# Patient Record
Sex: Female | Born: 1975 | Race: White | Hispanic: No | Marital: Married | Smoking: Never smoker
Health system: Southern US, Community
[De-identification: ages and names within clinical notes are randomized; demographics above are authoritative.]

## PROBLEM LIST (undated history)

## (undated) DIAGNOSIS — E119 Type 2 diabetes mellitus without complications: Secondary | ICD-10-CM

## (undated) DIAGNOSIS — E785 Hyperlipidemia, unspecified: Secondary | ICD-10-CM

## (undated) DIAGNOSIS — G4733 Obstructive sleep apnea (adult) (pediatric): Secondary | ICD-10-CM

## (undated) HISTORY — DX: Type 2 diabetes mellitus without complications: E11.9

## (undated) HISTORY — DX: Obstructive sleep apnea (adult) (pediatric): G47.33

## (undated) HISTORY — DX: Hyperlipidemia, unspecified: E78.5

## (undated) HISTORY — PX: BILATERAL SALPINGECTOMY: SHX5743

## (undated) HISTORY — PX: TOTAL VAGINAL HYSTERECTOMY: SHX2548

---

## 2016-12-03 ENCOUNTER — Emergency Department
Admission: EM | Admit: 2016-12-03 | Discharge: 2016-12-03 | Disposition: A | Discharge status: Home / Self Care | Payer: Self-pay | Attending: Emergency Medicine | Admitting: Emergency Medicine

## 2016-12-03 DIAGNOSIS — H1033 Unspecified acute conjunctivitis, bilateral: Secondary | ICD-10-CM

## 2016-12-03 DIAGNOSIS — J069 Acute upper respiratory infection, unspecified: Secondary | ICD-10-CM | POA: Insufficient documentation

## 2016-12-03 DIAGNOSIS — H1089 Other conjunctivitis: Secondary | ICD-10-CM | POA: Insufficient documentation

## 2016-12-03 MED ORDER — PSEUDOEPH-BROMPHEN-DM 30-2-10 MG/5ML PO SYRP: 5.0000 mL | ORAL_SOLUTION | Freq: Four times a day (QID) | ORAL | 0 refills | Status: DC | PRN

## 2016-12-03 MED ORDER — DEXAMETHASONE SODIUM PHOSPHATE 10 MG/ML IJ SOLN
10.0000 mg | Freq: Once | INTRAMUSCULAR | Status: AC
  Administered 2016-12-03: 10 mg via INTRAMUSCULAR
  Filled 2016-12-03: qty 1

## 2016-12-03 MED ORDER — GENTAMICIN SULFATE 0.3 % OP SOLN: 2.0000 [drp] | OPHTHALMIC | 0 refills | Status: DC

## 2016-12-03 MED ORDER — FLUTICASONE PROPIONATE 50 MCG/ACT NA SUSP: 2.0000 | Freq: Every day | NASAL | 0 refills | Status: DC

## 2016-12-03 MED ORDER — BENZONATATE 100 MG PO CAPS: 100.0000 mg | ORAL_CAPSULE | Freq: Three times a day (TID) | ORAL | 0 refills | Status: DC | PRN

## 2016-12-03 NOTE — Discharge Instructions (Signed)
Take the prescription meds as directed. Continue to rest and treat fevers as needed. Follow-up with your provider or [facility] for continued symptoms. Rest and hydrate.

## 2016-12-03 NOTE — ED Triage Notes (Signed)
Sob when coughing, yellow productive sputum in small amounts per patient. Sore throat. Sx X 2 days. Eyes reddened. Pt alert and oriented X4, active, cooperative, pt in NAD. RR even and unlabored, color WNL.

## 2016-12-03 NOTE — ED Provider Notes (Signed)
[facility] Emergency Department Provider Note ____________________________________________  Time seen: 1608  I have reviewed the triage vital signs and the nursing notes.  HISTORY  Chief Complaint  Cough; Shortness of Breath; and Sore Throat  HPI Gloria Wade is a 41 y.o. female presents to the ED with complaints of 2 days of body aches, sudden cough, with intermittent productive sputum, as well as sore throat. Patient is also noting I irritation bilaterally with matting and crusting. She reports only subjective fevers over the last few days but has been taking over-the-counter cough and cold medication. She's also had intermittent episodes of nausea, vomiting, and diarrhea.  History reviewed. No pertinent past medical history.  There are no active problems to display for this patient.  History reviewed. No pertinent surgical history.  Prior to Admission medications   Medication Sig Start Date End Date Taking? Authorizing Provider  benzonatate (TESSALON PERLES) 100 MG capsule Take 1 capsule (100 mg total) by mouth 3 (three) times daily as needed for cough (Take 1-2 per dose). 12/03/16   Deborrah Mabin V Bacon Pamella Samons, PA-C  brompheniramine-pseudoephedrine-DM 30-2-10 MG/5ML syrup Take 5 mLs by mouth 4 (four) times daily as needed. 12/03/16   Nayquan Evinger V Bacon Seneca Gadbois, PA-C  fluticasone (FLONASE) 50 MCG/ACT nasal spray Place 2 sprays into both nostrils daily. 12/03/16   Margreat Widener V Bacon Carylon Tamburro, PA-C  gentamicin (GARAMYCIN) 0.3 % ophthalmic solution Place 2 drops into both eyes every 4 (four) hours. 12/03/16   Charlesetta IvoryJenise V Bacon Keena Heesch, PA-C    Allergies Patient has no known allergies.  No family history on file.  Social History Social History  Substance Use Topics  . Smoking status: Never Smoker  . Smokeless tobacco: Never Used  . Alcohol use No    Review of Systems  Constitutional: Negative for fever. Eyes: Negative for visual changes. ENT: Positive for sore  throat. Cardiovascular: Negative for chest pain. Respiratory: Negative for shortness of breath. Gastrointestinal: Negative for abdominal pain. Reports vomiting and diarrhea. Genitourinary: Negative for dysuria. Musculoskeletal: Negative for back pain. Reports generalized bodyaches. Skin: Negative for rash. Neurological: Negative for headaches, focal weakness or numbness. ____________________________________________  PHYSICAL EXAM:  VITAL SIGNS: ED Triage Vitals [12/03/16 1441]  Enc Vitals Group     BP (!) 152/84     Pulse Rate 97     Resp 16     Temp 98.7 F (37.1 C)     Temp Source Oral     SpO2 98 %     Weight 178 lb (80.7 kg)     Height 5' (1.524 m)     Head Circumference      Peak Flow      Pain Score 7     Pain Loc      Pain Edu?      Excl. in GC?     Constitutional: Alert and oriented. Well appearing and in no distress. Head: Normocephalic and atraumatic. Eyes: Conjunctivae are normal. PERRL. Normal extraocular movements Ears: Canals clear. TMs intact bilaterally. Nose: No congestion/rhinorrhea/epistaxis. Nasal turbinate erythema and dryness and dried purulent material.  Mouth/Throat: Mucous membranes are moist. Uvula is midline and tonsils are flat. Mild oropharyngeal erythema is appreciated without exudate. Neck: Supple. No thyromegaly. Hematological/Lymphatic/Immunological: No cervical lymphadenopathy. Cardiovascular: Normal rate, regular rhythm. Normal distal pulses. Respiratory: Normal respiratory effort. No wheezes/rales/rhonchi. Skin:  Skin is warm, dry and intact. No rash noted. ____________________________________________   LABS (pertinent positives/negatives) Labs Reviewed  CULTURE, GROUP A STREP [facility]   Rapid Strep - Negative  ____________________________________________  PROCEDURES  Decadron 10 mg PO ____________________________________________  INITIAL IMPRESSION / ASSESSMENT AND PLAN / ED COURSE  Patient with a presentation likely  representing a bowel upper respiratory infection including influenza. Patient is discharged at this time with prescriptions for Tessalon Perles, Bromfed-DM syrup, and Flonase. She is also discharged with a prescription for Garamycin eyedrops for her bilateral bacterial conjunctivitis. She is advised to follow with her primary care provider for ongoing symptom management. She should return as necessary for acutely worsening symptoms. ____________________________________________  FINAL CLINICAL IMPRESSION(S) / ED DIAGNOSES  Final diagnoses:  Viral upper respiratory tract infection  Acute bacterial conjunctivitis of both eyes      Lissa Hoard, PA-C 12/03/16 1749    Myrna Blazer, MD 12/03/16 2351

## 2018-09-07 ENCOUNTER — Emergency Department (HOSPITAL_COMMUNITY)
Admission: EM | Admit: 2018-09-07 | Discharge: 2018-09-07 | Disposition: A | Discharge status: Home / Self Care | Payer: Self-pay | Attending: Emergency Medicine | Admitting: Emergency Medicine

## 2018-09-07 ENCOUNTER — Emergency Department (HOSPITAL_BASED_OUTPATIENT_CLINIC_OR_DEPARTMENT_OTHER): Payer: Self-pay

## 2018-09-07 DIAGNOSIS — Z79899 Other long term (current) drug therapy: Secondary | ICD-10-CM | POA: Insufficient documentation

## 2018-09-07 DIAGNOSIS — M79605 Pain in left leg: Secondary | ICD-10-CM | POA: Insufficient documentation

## 2018-09-07 DIAGNOSIS — R52 Pain, unspecified: Secondary | ICD-10-CM

## 2018-09-07 MED ORDER — MELOXICAM 7.5 MG PO TABS: 7.5000 mg | ORAL_TABLET | Freq: Every day | ORAL | 0 refills | Status: AC

## 2018-09-07 NOTE — Progress Notes (Signed)
Preliminary notes--Left lower extremity venous duplex exam completed. Negative for DVT.  Nimrit Kehres H Emmarose Klinke(RDMS RVT) 09/07/18 4:47 PM

## 2018-09-07 NOTE — ED Triage Notes (Signed)
Pt was sent here from UC to rule out blood clot after having left calf pain into left thigh.

## 2018-09-07 NOTE — ED Provider Notes (Signed)
[facility] EMERGENCY DEPARTMENT Provider Note   CSN: [id number] Arrival date & time: 09/07/18  1313     History   Chief Complaint Chief Complaint  Patient presents with  . Leg Pain    HPI Gloria Wade is a 42 y.o. female.  42 year old female with complaint of leg pain.  Patient states that she had intermittent posterior left knee pain for the past month, Pain Now Radiates down Her Left and up her left groin areas.  Patient notes swelling of the left leg yesterday, she applied topical pain relieving medication to her by which she thinks helped with the swelling.  Denies history of injury to the extremity, recent extended travel, history of cancer, oral hormone replacement therapy use.  No personal or family history of PE or DVT.     No past medical history on file.  There are no active problems to display for this patient.   No past surgical history on file.   OB History   None      Home Medications    Prior to Admission medications   Medication Sig Start Date End Date Taking? Authorizing Provider  benzonatate (TESSALON PERLES) 100 MG capsule Take 1 capsule (100 mg total) by mouth 3 (three) times daily as needed for cough (Take 1-2 per dose). 12/03/16   Menshew, Charlesetta Ivory, PA-C  brompheniramine-pseudoephedrine-DM 30-2-10 MG/5ML syrup Take 5 mLs by mouth 4 (four) times daily as needed. 12/03/16   Menshew, Charlesetta Ivory, PA-C  fluticasone (FLONASE) 50 MCG/ACT nasal spray Place 2 sprays into both nostrils daily. 12/03/16   Menshew, Charlesetta Ivory, PA-C  gentamicin (GARAMYCIN) 0.3 % ophthalmic solution Place 2 drops into both eyes every 4 (four) hours. 12/03/16   Menshew, Charlesetta Ivory, PA-C  naproxen sodium (ALEVE) 220 MG tablet Take 220 mg by mouth every 12 (twelve) hours as needed for pain.    [provider]    Family History No family history on file.  Social History Social History   Tobacco Use  . Smoking status: Never Smoker  .  Smokeless tobacco: Never Used  Substance Use Topics  . Alcohol use: No  . Drug use: Not on file     Allergies   Patient has no known allergies.   Review of Systems Review of Systems  Constitutional: Negative for fever.  Respiratory: Negative for shortness of breath.   Cardiovascular: Negative for chest pain.  Musculoskeletal: Positive for arthralgias and myalgias. Negative for gait problem and joint swelling.  Skin: Negative for color change, rash and wound.  Allergic/Immunologic: Negative for immunocompromised state.  Hematological: Negative for adenopathy. Does not bruise/bleed easily.  Psychiatric/Behavioral: Negative for confusion.  All other systems reviewed and are negative.    Physical Exam Updated Vital Signs BP 134/71 (BP Location: Right Arm)   Pulse 89   Temp 98.1 F (36.7 C) (Oral)   Resp 16   Ht 5\' 1"  (1.549 m)   Wt 76.7 kg   SpO2 100%   BMI 31.93 kg/m   Physical Exam  Constitutional: She is oriented to person, place, and time. She appears well-developed and well-nourished. No distress.  HENT:  Head: Normocephalic and atraumatic.  Pulmonary/Chest: Effort normal.  Musculoskeletal: She exhibits tenderness. She exhibits no deformity.       Left knee: She exhibits normal range of motion, no swelling, no effusion, no ecchymosis and no deformity. Tenderness found.       Legs: Neurological: She is alert and oriented to  person, place, and time. No sensory deficit.  Skin: Skin is warm and dry. No rash noted. She is not diaphoretic. No erythema.  Psychiatric: She has a normal mood and affect. Her behavior is normal.  Nursing note and vitals reviewed.    ED Treatments / Results  Labs (all labs ordered are listed, but only abnormal results are displayed) Labs Reviewed - No data to display  EKG None  Radiology No results found.  Procedures Procedures (including critical care time)  Medications Ordered in ED Medications - No data to  display   Initial Impression / Assessment and Plan / ED Course  I have reviewed the triage vital signs and the nursing notes.  Pertinent labs & imaging results that were available during my care of the patient were reviewed by me and considered in my medical decision making (see chart for details).  Clinical Course as of Sep 08 1727  Tue Sep 07, 2018  [id number] 49-year-old female with left leg pain.  Venous Doppler is negative for DVT.  Advised patient to continue with Motrin and Tylenol as needed as directed for pain, also recommend compression hose.  Patient has a PCP.  Advised patient to call her PCP in the morning to arrange follow-up care, return to ER for worsening or concerning symptoms.   [LM]    Clinical Course User Index [LM] Jeannie Fend, PA-C   Final Clinical Impressions(s) / ED Diagnoses   Final diagnoses:  Left leg pain    ED Discharge Orders    None       Jeannie Fend, PA-C 09/07/18 1728    Derwood Kaplan, MD 09/08/18 1310

## 2018-09-07 NOTE — ED Provider Notes (Signed)
Patient placed in Quick Look pathway, seen and evaluated   Chief Complaint:left lower extremity pain  HPI:  Gloria Wade is a 42 y.o. female who presents to the ED from Urgent Care for evaluation of left lower extremity pain. Patient reports that the pain goes from her calf to her posterior thigh. Patient denies any recent travel.    ROS: M/S: left leg pain  Physical Exam:  BP 134/71 (BP Location: Right Arm)   Pulse 89   Temp 98.1 F (36.7 C) (Oral)   Resp 16   Ht 5\' 1"  (1.549 m)   Wt 76.7 kg   SpO2 100%   BMI 31.93 kg/m    Gen: No distress  Neuro: Awake and Alert  Skin: Warm and dry  M/S: tender left calf on exam    Initiation of care has begun. The patient has been counseled on the process, plan, and necessity for staying for the completion/evaluation, and the remainder of the medical screening examination    Janne Napoleon, NP 09/07/18 1330    Linwood Dibbles, MD 09/07/18 [phone]

## 2018-09-07 NOTE — ED Notes (Signed)
Patient transported to vascular. 

## 2018-09-07 NOTE — Discharge Instructions (Addendum)
Your ultrasound today does not show a blood clot in your leg. You should follow up with a primary care provider, a referral was given if needed. Return to the ER for worsening or concerning symptoms.  For your pain try wearing compression socks- apply first thing in the morning before getting out of bed.

## 2020-01-10 ENCOUNTER — Ambulatory Visit: Payer: Self-pay | Admitting: Primary Care

## 2020-01-31 ENCOUNTER — Encounter: Payer: Self-pay | Admitting: Primary Care

## 2020-01-31 ENCOUNTER — Ambulatory Visit (INDEPENDENT_AMBULATORY_CARE_PROVIDER_SITE_OTHER)
Admission: RE | Admit: 2020-01-31 | Discharge: 2020-01-31 | Disposition: A | Discharge status: Home / Self Care | Payer: 59 | Source: Ambulatory Visit | Attending: Primary Care | Admitting: Primary Care

## 2020-01-31 ENCOUNTER — Ambulatory Visit (INDEPENDENT_AMBULATORY_CARE_PROVIDER_SITE_OTHER): Payer: 59 | Admitting: Primary Care

## 2020-01-31 ENCOUNTER — Other Ambulatory Visit: Payer: Self-pay

## 2020-01-31 VITALS — BP 120/82 | HR 82 | Temp 96.4°F | Ht 59.0 in | Wt 174.2 lb

## 2020-01-31 DIAGNOSIS — R002 Palpitations: Secondary | ICD-10-CM

## 2020-01-31 DIAGNOSIS — M25562 Pain in left knee: Secondary | ICD-10-CM

## 2020-01-31 DIAGNOSIS — R0683 Snoring: Secondary | ICD-10-CM | POA: Diagnosis not present

## 2020-01-31 DIAGNOSIS — G8929 Other chronic pain: Secondary | ICD-10-CM | POA: Diagnosis not present

## 2020-01-31 DIAGNOSIS — M25561 Pain in right knee: Secondary | ICD-10-CM | POA: Diagnosis not present

## 2020-01-31 DIAGNOSIS — M2550 Pain in unspecified joint: Secondary | ICD-10-CM | POA: Diagnosis not present

## 2020-01-31 DIAGNOSIS — G4733 Obstructive sleep apnea (adult) (pediatric): Secondary | ICD-10-CM | POA: Insufficient documentation

## 2020-01-31 LAB — BASIC METABOLIC PANEL
BUN: 15 mg/dL (ref 6–23)
CO2: 30 mEq/L (ref 19–32)
Calcium: 9.3 mg/dL (ref 8.4–10.5)
Chloride: 101 mEq/L (ref 96–112)
Creatinine, Ser: 0.67 mg/dL (ref 0.40–1.20)
GFR: 94.87 mL/min (ref >60.00)
Glucose, Bld: 145 mg/dL — ABNORMAL HIGH (ref 70–99)
Potassium: 3.6 mEq/L (ref 3.5–5.1)
Sodium: 137 mEq/L (ref 135–145)

## 2020-01-31 LAB — CBC
HCT: 36.9 % (ref 36.0–46.0)
Hemoglobin: 12 g/dL (ref 12.0–15.0)
MCHC: 32.6 g/dL (ref 30.0–36.0)
MCV: 75 fl — ABNORMAL LOW (ref 78.0–100.0)
Platelets: 321 10*3/uL (ref 150.0–400.0)
RBC: 4.92 Mil/uL (ref 3.87–5.11)
RDW: 17.6 % — ABNORMAL HIGH (ref 11.5–15.5)
WBC: 8.2 10*3/uL (ref 4.0–10.5)

## 2020-01-31 LAB — SEDIMENTATION RATE: Sed Rate: 53 mm/hr — ABNORMAL HIGH (ref 0–20)

## 2020-01-31 LAB — TSH: TSH: 3.54 u[IU]/mL (ref 0.35–4.50)

## 2020-01-31 LAB — C-REACTIVE PROTEIN: CRP: <1 mg/dL (ref 0.5–20.0)

## 2020-01-31 NOTE — Assessment & Plan Note (Signed)
Chronic, also with periods of apnea and daytime tiredness.  Referral placed to pulmonology for evaluation and sleep study.

## 2020-01-31 NOTE — Assessment & Plan Note (Signed)
Chronic and to numerous areas of her body. No swelling on exam.  Check CCP, RF, Sed rate, CRP.

## 2020-01-31 NOTE — Progress Notes (Signed)
Subjective:    Patient ID: Gloria Wade, female    DOB: Jul 10, 1976, 44 y.o.   MRN: [id number]  HPI  This visit occurred during the SARS-CoV-2 public health emergency.  Safety protocols were in place, including screening questions prior to the visit, additional usage of staff PPE, and extensive cleaning of exam room while observing appropriate contact time as indicated for disinfecting solutions.   Gloria Wade is a 44 year old female who presents today to establish care and discuss the problems mentioned below. Will obtain/review records.  1) Snoring: Chronic for years. Her husband has witnessed periods of apnea and choking while she sleeps during the night. She's actually woken from sleep during the night, gasping for air and choking. Thinks she may have sleep apnea, but she's never had a sleep study.  2) Chronic Knee Pain/Joint Pain: Located to bilateral knees for the last 5-6 years, worse to left lateral anterior knee. Also with tingling to left lower extremity that occurs intermittently with walking, this began about two years ago. She does have right lower back and hip pain, improved with chiropractic treatments thus far, she has 6 treatments left. She underwent xray several weeks ago of the right hip, unsure of the results. Also with bilateral elbow pain.  She works on her feet and stands for 12 hours at a time, most everyday of the week. She does wear comfortable shoes with extra insoles. She denies weakness.   3) Palpitations: Intermittent for the last two years, occurring most everyday, lasting a few seconds at a time. She denies chest pain, anxiety, shortness of breath, dizziness, family history of heart disease.   BP Readings from Last 3 Encounters:  01/31/20 120/82  09/07/18 139/81  12/03/16 (!) 152/84     Review of Systems  Eyes: Negative for visual disturbance.  Respiratory: Negative for shortness of breath.        Snoring with periods of apnea during sleep.  Cardiovascular:  Positive for palpitations. Negative for chest pain.  Musculoskeletal: Positive for arthralgias. Negative for joint swelling.       Elbows, right hip, right lower back, bilateral knees  Neurological: Negative for dizziness and headaches.  Psychiatric/Behavioral: The patient is not nervous/anxious.        No past medical history on file.   Social History   Socioeconomic History  . Marital status: Married    Spouse name: Not on file  . Number of children: Not on file  . Years of education: Not on file  . Highest education level: Not on file  Occupational History  . Not on file  Tobacco Use  . Smoking status: Never Smoker  . Smokeless tobacco: Never Used  Substance and Sexual Activity  . Alcohol use: No  . Drug use: Not on file  . Sexual activity: Not on file  Other Topics Concern  . Not on file  Social History Narrative  . Not on file   Social Determinants of Health   Financial Resource Strain:   . Difficulty of Paying Living Expenses:   Food Insecurity:   . Worried About Programme researcher, broadcasting/film/video in the Last Year:   . Barista in the Last Year:   Transportation Needs:   . Freight forwarder (Medical):   Marland Kitchen Lack of Transportation (Non-Medical):   Physical Activity:   . Days of Exercise per Week:   . Minutes of Exercise per Session:   Stress:   . Feeling of Stress :  Social Connections:   . Frequency of Communication with Friends and Family:   . Frequency of Social Gatherings with Friends and Family:   . Attends Religious Services:   . Active Member of Clubs or Organizations:   . Attends Archivist Meetings:   Marland Kitchen Marital Status:   Intimate Partner Violence:   . Fear of Current or Ex-Partner:   . Emotionally Abused:   Marland Kitchen Physically Abused:   . Sexually Abused:     No past surgical history on file.  No family history on file.  No Known Allergies  No current outpatient medications on file prior to visit.   No current facility-administered  medications on file prior to visit.    BP 120/82   Pulse 82   Temp (!) 96.4 F (35.8 C) (Temporal)   Ht 4\' 11"  (1.499 m)   Wt 174 lb 4 oz (79 kg)   LMP  (Exact Date)   SpO2 98%   BMI 35.19 kg/m    Objective:   Physical Exam  Constitutional: She appears well-nourished.  Cardiovascular: Normal rate and regular rhythm.  Respiratory: Effort normal and breath sounds normal.  Musculoskeletal:     Cervical back: Neck supple.     Left knee: No swelling, deformity, effusion or erythema. Normal range of motion. Tenderness present over the lateral joint line.       Legs:     Comments: 5/5 strength with knee extension bilaterally   Skin: Skin is warm and dry.  Psychiatric: She has a normal mood and affect.           Assessment & Plan:

## 2020-01-31 NOTE — Patient Instructions (Signed)
?????????????????????????   xray ?????????????????????? ???????????????????????????????????????????????????  ??????????????????????????????????????????????????????????????????????????????????????????? ???????????????????????????????????????????????????????????????  ???????????????????????????????????????????????????????????????????????  ?????????????????????????????????????????! ?????????????????????????????????????????????????????? ?????????????????????????! banhchhob daoy montirpisaoth ning xray  moun pel chakchenh now thngainih .  khnhom nung choundamnoeng anak ampi lotthophl robsa anak nowpel ttuol ban .   anak nung trauv ban teaktng teaktng nung kar banhchoun anak tow vechchobandet suot daembi veayotamlei kar keng min dakadangheum .  saum brab yeung brasenbae anak min trauv ban teaktng knongorypel pir sa bta  .   kamntpelvelea chuob cheamuoy vechchobandet kau l len teaktng nung chongkong ning sanleak robsa anak .   khnhom mean sechaktei rikreay del ban chuob anak thngainih!  saumkom stakstaer knong kar haw ryy phnhae sar mk khnhom daoy mean saamnuor .  saumosvakom mokkan li bu vu r!   Stop by the lab and xray prior to leaving today. I will notify you of your results once received.   You will be contacted regarding your referral to the lung doctor for evaluation of sleep apnea.  Please let us know if you have not been contacted within two weeks.   Schedule a visit with Dr. Patsy Lager regarding your knees and joints.  It was a pleasure to meet you today! Please don't hesitate to call or message me with any questions. Welcome to [facility]!

## 2020-01-31 NOTE — Assessment & Plan Note (Signed)
Intermittent for 2 years, occurring daily, lasting seconds. No other accompanying symptoms. Rate and rhythm regular today.   Check labs including TSH, CBC, BMP. Consider ECG.  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 No family history of thyroid disorder or heart disease. Await labs. Consider Holter Monitor evaluation.

## 2020-01-31 NOTE — Assessment & Plan Note (Signed)
Chronic bilaterally, worse to left.   Suspect pain to be caused by multiple factors including obesity, standing on concrete. She does seem to have numerous other joint aches.  Check plain films today. Also check CCP, RF, Sed rate.  She will schedule a visit with sports medicine.

## 2020-02-01 ENCOUNTER — Other Ambulatory Visit: Payer: 59

## 2020-02-01 DIAGNOSIS — R739 Hyperglycemia, unspecified: Secondary | ICD-10-CM

## 2020-02-01 LAB — RHEUMATOID FACTOR: Rhuematoid fact SerPl-aCnc: <14 IU/mL (ref <14)

## 2020-02-01 LAB — CYCLIC CITRUL PEPTIDE ANTIBODY, IGG: Cyclic Citrullin Peptide Ab: <16 UNITS

## 2020-02-02 LAB — HEMOGLOBIN A1C
Hgb A1c MFr Bld: 5.5 % of total Hgb (ref <5.7)
Mean Plasma Glucose: 111 (calc)
eAG (mmol/L): 6.2 (calc)

## 2020-02-07 NOTE — Progress Notes (Signed)
Carlyann Placide T. Jayvion Stefanski, MD, [place]  [facility] and [place] at [facility] [place], [address]  Phone: [phone]  FAX: [phone]  Gloria Wade - 44 y.o. female  MRN [id number]  Date of Birth: [birth year]  Date: 02/08/2020  PCP: Pleas Koch, NP  Referral: Pleas Koch, NP  Chief Complaint  Patient presents with  . Knee Pain    Bilateral  . Pain in Joints  . Back Pain    Have been seeing a chiropractor    This visit occurred during the SARS-CoV-2 public health emergency.  Safety protocols were in place, including screening questions prior to the visit, additional usage of staff PPE, and extensive cleaning of exam room while observing appropriate contact time as indicated for disinfecting solutions.   Subjective:   Gloria Wade is a 44 y.o. very pleasant female patient with Body mass index is 35.24 kg/m. who presents with the following:  She is a pleasant young lady who presents with some bilateral knee pain.  I independently reviewed her plain films myself and there is some mild to moderate degenerative changes.  This is most obvious in the medial compartment.  As I talked to the patient however, she relates that she is having pain throughout much of her body.  She has hand pain, finger pain, wrist pain, shoulder pain, neck pain, back pain.  She also has knee pain, left greater than right.  She has had an effusion in the left knee.  She has pain in the morning lasting at least 30 minutes at a time.  She has difficulty quantifying exactly the length of time of all of her symptoms, but it may be on the order of years.  I reviewed all of her prior lab work.  Her rheumatoid factor and CCP were negative, but it is noteworthy that she had a sed rate of 53.  At that time her CRP was normal.  She also has some tingling on the lateral aspect of her left lower extremity.  Since she does have some other  tingling sometimes in her hands and other parts of her body as well  Sed rate is 53.  Peroneal nerve entrapment.   Multijoint inj  Has been to chiropractor.  Has been a few times.  Motrin will help   Review of Systems is noted in the HPI, as appropriate   Objective:   BP 108/60   Pulse 94   Temp 98.3 F (36.8 C) (Temporal)   Ht 4\' 11"  (1.499 m)   Wt 174 lb 8 oz (79.2 kg)   LMP 11/18/2016   SpO2 99%   BMI 35.24 kg/m   GEN: No acute distress; alert,appropriate. PULM: Breathing comfortably in no respiratory distress PSYCH: Normally interactive.   She has bogginess essentially at all MCP joints and really none in the IP joints.  She has some limitation of movement at the wrist bilaterally.  Full range of motion at the elbow, shoulder, neck without significant limitation or terminal pain.  There is no inducible radiculopathy cervically.  She does have a moderate sized effusion on the left that is ballotable.  She has medial joint line tenderness on the left with minimal lateral.  All ligamentous structures are intact.  She does have some mild pain with bounce home and flexion pinch testing.  No mechanical symptoms.  She also has some modest pain in her low back with palpation and moving in bending.  Radiology: DG Knee 4 Views W/Patella Left  Result Date: 01/31/2020 CLINICAL DATA:  Chronic knee pain EXAM: LEFT KNEE - COMPLETE 4+ VIEW COMPARISON:  None. FINDINGS: No fracture or malalignment. Mild to moderate medial joint space narrowing with spurring. Mild lateral degenerative spurring. Possible small knee effusion. IMPRESSION: Mild to moderate arthritis of the knee with possible small effusion Electronically Signed   By: Jasmine Pang M.D.   On: 01/31/2020 23:41    Assessment and Plan:     ICD-10-CM   1. Polyarthralgia  M25.50 ANA Screen,IFA,Reflex Titer/Pattern,Reflex Mplx 11 Ab Cascade with IdentRA  2. Arthritis of both knees  M17.0 ANA Screen,IFA,Reflex  Titer/Pattern,Reflex Mplx 11 Ab Cascade with IdentRA  3. 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 On the day of the patient encounter, this can include review of prior records, labs, and imaging.  Additional time can include counselling, consultation with peer MD in person or by telephone.  This also includes independent review of Radiology.  She really has multiple joint complaints involving much of her body.  She appears to have some synovitis on her hand and MCP joints.  She does have a left knee effusion.  Independently reviewed radiographs and there is some medial compartmental joint space loss.  Interesting case, but this is not isolated arthritis of the knee alone.  I will check an ANA cascade panel to evaluate further.  Polymyalgia rheumatica also cannot be excluded.  If the remainder of her work-up is normal, then I am going to try giving her a dosage of prednisone 15 mg and see if this resolves her symptoms.  If this is the case, then polymyalgia rheumatica would certainly be at the highest of her differential.  She also appears to have an isolated peroneal nerve entrapment on the left lower extremity.  I appreciate the opportunity to evaluate this very friendly patient. If you have any question regarding her care or prognosis, do not hesitate to ask.   Follow-up: This will entirely depend on her further testing  No orders of the defined types were placed in this encounter.  There are no discontinued medications. Orders Placed This Encounter  Procedures  . ANA Screen,IFA,Reflex Titer/Pattern,Reflex Mplx 11 Ab Cascade with [facility],  Maryse Brierley T. Sarath Privott, MD   No outpatient encounter medications on file as of 02/08/2020.   No facility-administered encounter medications on file as of 02/08/2020.

## 2020-02-08 ENCOUNTER — Ambulatory Visit (INDEPENDENT_AMBULATORY_CARE_PROVIDER_SITE_OTHER): Payer: 59 | Admitting: Family Medicine

## 2020-02-08 ENCOUNTER — Other Ambulatory Visit: Payer: Self-pay

## 2020-02-08 ENCOUNTER — Encounter: Payer: Self-pay | Admitting: Family Medicine

## 2020-02-08 VITALS — BP 108/60 | HR 94 | Temp 98.3°F | Ht 59.0 in | Wt 174.5 lb

## 2020-02-08 DIAGNOSIS — M2550 Pain in unspecified joint: Secondary | ICD-10-CM

## 2020-02-08 DIAGNOSIS — G5702 Lesion of sciatic nerve, left lower limb: Secondary | ICD-10-CM

## 2020-02-08 DIAGNOSIS — M170 Bilateral primary osteoarthritis of knee: Secondary | ICD-10-CM

## 2020-02-14 LAB — ANTI-NUCLEAR AB-TITER (ANA TITER)
ANA TITER: 1:320 {titer} — ABNORMAL HIGH
ANA TITER: 1:640 {titer} — ABNORMAL HIGH
ANA Titer 1: 1:640 {titer} — ABNORMAL HIGH

## 2020-02-14 LAB — TIER 2
Jo-1 Autoabs: <1 AI
SSA (Ro) (ENA) Antibody, IgG: <1 AI
SSB (La) (ENA) Antibody, IgG: <1 AI
Scleroderma (Scl-70) (ENA) Antibody, IgG: <1 AI

## 2020-02-14 LAB — TIER 3
Centromere Ab Screen: <1 AI
Ribosomal P Protein Ab: <1 AI

## 2020-02-14 LAB — ANA SCREEN,IFA,REFLEX TITER/PATTERN,REFLEX MPLX 11 AB CASCADE
14-3-3 eta Protein: <0.2 ng/mL (ref <0.2)
Anti Nuclear Antibody (ANA): POSITIVE — AB
Cyclic Citrullin Peptide Ab: <16 UNITS
Rhuematoid fact SerPl-aCnc: <14 IU/mL (ref <14)

## 2020-02-14 LAB — TIER 1
Chromatin (Nucleosomal) Antibody: <1 AI
ENA SM Ab Ser-aCnc: <1 AI
Ribonucleic Protein(ENA) Antibody, IgG: <1 AI
SM/RNP: <1 AI
ds DNA Ab: <1 IU/mL

## 2020-02-14 LAB — INTERPRETATION

## 2020-02-24 ENCOUNTER — Other Ambulatory Visit: Payer: Self-pay | Admitting: Family Medicine

## 2020-02-24 DIAGNOSIS — M2550 Pain in unspecified joint: Secondary | ICD-10-CM

## 2020-02-24 DIAGNOSIS — R768 Other specified abnormal immunological findings in serum: Secondary | ICD-10-CM

## 2020-02-24 NOTE — Progress Notes (Signed)
Consult Rheum

## 2020-03-21 ENCOUNTER — Ambulatory Visit (INDEPENDENT_AMBULATORY_CARE_PROVIDER_SITE_OTHER): Payer: 59 | Admitting: Acute Care

## 2020-03-21 ENCOUNTER — Encounter: Payer: Self-pay | Admitting: Acute Care

## 2020-03-21 ENCOUNTER — Other Ambulatory Visit: Payer: Self-pay

## 2020-03-21 VITALS — BP 106/72 | HR 85 | Temp 98.0°F | Ht <= 58 in | Wt 177.6 lb

## 2020-03-21 DIAGNOSIS — R5383 Other fatigue: Secondary | ICD-10-CM

## 2020-03-21 DIAGNOSIS — R0683 Snoring: Secondary | ICD-10-CM

## 2020-03-21 NOTE — Progress Notes (Signed)
History of Present Illness Gloria Wade is a 44 y.o.  female never smoker, with hx  Snoring,  palpitations, and some new chronic pain issues ( referred to [facility]).   She is here  for evaluation of suspected OSA. She will be followed by Dr. Mortimer Fries.   03/21/2020 Sleep Consult for Snoring/ Suspected OSA Pt. Presents for sleep consult. She states she has had issues with sleep and snoring for several years. She has noticed it is worse on the last several years. She works in the [employer] in [place]. She goes to bed at between 4-6 pm, and she gets up at 1 am to work. She works until 9 am. She states she awakens 5-6 times a night because her snoring is so loud she wakes herself up. She states she has not had a significant weight gain over the last several years. States her weight has been stable. 165 lbs.Her husband has witnessed apnea events.No family history of apnea. She sleeps in a hammock.. She feels this is easier to breath. She sleeps on her back in the hammock. Epworth score is 24.  She does endorse headaches in the morning.   Test Results:  CBC Latest Ref Rng & Units 01/31/2020  WBC 4.0 - 10.5 K/uL 8.2  Hemoglobin 12.0 - 15.0 g/dL 12.0  Hematocrit 36.0 - 46.0 % 36.9  Platelets 150.0 - 400.0 K/uL 321.0    BMP Latest Ref Rng & Units 01/31/2020  Glucose 70 - 99 mg/dL 145(H)  BUN 6 - 23 mg/dL 15  Creatinine 0.40 - 1.20 mg/dL 0.67  Sodium 135 - 145 mEq/L 137  Potassium 3.5 - 5.1 mEq/L 3.6  Chloride 96 - 112 mEq/L 101  CO2 19 - 32 mEq/L 30  Calcium 8.4 - 10.5 mg/dL 9.3    BNP No results found for: BNP  ProBNP No results found for: PROBNP  PFT No results found for: FEV1PRE, FEV1POST, FVCPRE, FVCPOST, TLC, DLCOUNC, PREFEV1FVCRT, PSTFEV1FVCRT  No results found.   Past medical hx History reviewed. No pertinent past medical history.   Social History   Tobacco Use  . Smoking status: Never Smoker  . Smokeless tobacco: Never Used  Substance Use Topics  . Alcohol use: No   . Drug use: Not on file    Ms.Easler reports that she has never smoked. She has never used smokeless tobacco. She reports that she does not drink alcohol.  Tobacco Cessation: Never smoker  Past surgical hx, Family hx, Social hx all reviewed.  No current outpatient medications on file prior to visit.   No current facility-administered medications on file prior to visit.     No Known Allergies  Review Of Systems:  Constitutional:   No  weight loss, night sweats,  Fevers, chills, + fatigue, or  lassitude.  HEENT:   No headaches,  Difficulty swallowing,  Tooth/dental problems, or  Sore throat,                No sneezing, itching, ear ache, nasal congestion, post nasal drip,   CV:  No chest pain,  Orthopnea, PND, swelling in lower extremities, anasarca, dizziness, palpitations, syncope.   GI  No heartburn, indigestion, abdominal pain, nausea, vomiting, diarrhea, change in bowel habits, loss of appetite, bloody stools.   Resp: No shortness of breath with exertion or at rest.  No excess mucus, no productive cough,  No non-productive cough,  No coughing up of blood.  No change in color of mucus.  No wheezing.  No chest  wall deformity  Skin: no rash or lesions.  GU: no dysuria, change in color of urine, no urgency or frequency.  No flank pain, no hematuria   MS:  + joint pain or swelling.  No decreased range of motion.  No back pain.  Psych:  No change in mood or affect. No depression or anxiety.  No memory loss.   Vital Signs BP 106/72 (BP Location: Left Arm, Patient Position: Sitting, Cuff Size: Normal)   Pulse 85   Temp 98 F (36.7 C) (Temporal)   Ht 4\' 9"  (1.448 m)   Wt 177 lb 9.6 oz (80.6 kg)   LMP 11/18/2016   SpO2 96%   BMI 38.43 kg/m    Physical Exam:  General- No distress,  A&Ox3, very pleasant ENT: No sinus tenderness, TM clear, pale nasal mucosa, no oral exudate,no post nasal drip, no LAN Cardiac: S1, S2, regular rate and rhythm, no murmur Chest: No wheeze/  rales/ dullness; no accessory muscle use, no nasal flaring, no sternal retractions Abd.: Soft Non-tender, ND, BS + Ext: No clubbing cyanosis, edema Neuro:  normal strength, MAE x 4, A&O x 3 Skin: No rashes, no lesions,  warm and dry Psych: normal mood and behavior   Assessment/Plan  Suspected OSA Epworth Score of 24 Plan We will  Order a Home Sleep Study. If your insurance requires an in lab study we will place that order and let you know.  Follow up Tele visit once Sleep study has been done to review results Please call the office once you have completed the sleep study so we can get your follow up tele visit scheduled Please do not drive if you are sleepy.  Please contact office for sooner follow up if symptoms do not improve or worsen or seek emergency care   Continue to work on weight loss, as the link between excess weight  and sleep apnea is well established.   Remember to establish a good bedtime routine, and work on sleep hygiene.  Limit daytime naps , avoid stimulants such as caffeine and nicotine close to bedtime, exercise daily to promote sleep quality, avoid heavy , spicy, fried , or rich foods before bed. Ensure adequate exposure to natural light during the day,establish a relaxing bedtime routine with a pleasant sleep environment ( Bedroom between 60 and 67 degrees, turn off bright lights , TV or device screens screens , consider black out curtains or white noise machines) Do not drive if sleepy.   This appointment was 30 min long with over 50% of the time in direct face-to-face patient care, assessment, plan of care, and follow-up.   11/20/2016, NP 03/21/2020  2:16 PM

## 2020-03-21 NOTE — Patient Instructions (Addendum)
It is nice to meet you today. We will  Order a Home Sleep Study. If your insurance requires an in lab study we will place that order and let you know.  Follow up Tele visit once Sleep study has been done to review results Please call the office once you have completed the sleep study so we can get your follow up tele visit scheduled Please do not drive if you are sleepy.  Please contact office for sooner follow up if symptoms do not improve or worsen or seek emergency care   Continue to work on weight loss, as the link between excess weight  and sleep apnea is well established.   Remember to establish a good bedtime routine, and work on sleep hygiene.  Limit daytime naps , avoid stimulants such as caffeine and nicotine close to bedtime, exercise daily to promote sleep quality, avoid heavy , spicy, fried , or rich foods before bed. Ensure adequate exposure to natural light during the day,establish a relaxing bedtime routine with a pleasant sleep environment ( Bedroom between 60 and 67 degrees, turn off bright lights , TV or device screens screens , consider black out curtains or white noise machines) Do not drive if sleepy.

## 2020-06-01 ENCOUNTER — Ambulatory Visit: Payer: 59

## 2020-06-01 DIAGNOSIS — R0683 Snoring: Secondary | ICD-10-CM

## 2020-06-01 DIAGNOSIS — G4733 Obstructive sleep apnea (adult) (pediatric): Secondary | ICD-10-CM | POA: Diagnosis not present

## 2020-06-20 DIAGNOSIS — G4733 Obstructive sleep apnea (adult) (pediatric): Secondary | ICD-10-CM

## 2020-06-21 ENCOUNTER — Telehealth: Payer: Self-pay | Admitting: Acute Care

## 2020-06-21 DIAGNOSIS — G4733 Obstructive sleep apnea (adult) (pediatric): Secondary | ICD-10-CM

## 2020-06-21 NOTE — Telephone Encounter (Signed)
Called and spoke to pt. Informed her of the results and recs per Kandice Robinsons, NP. Pt verbalized understanding. Order placed for CPAP and for ONO on CPAP. Pt aware to call our office back if she has not heard from DME in 5-7 business days.   Will forward to Kandice Robinsons, NP, as Lorain Childes.

## 2020-06-21 NOTE — Telephone Encounter (Signed)
I have tried to call the patient with the results of her home sleep study. She did not answer. I left her a message to call the office and ask for Triage.  If you do not get a call back from her please call her again this afternoon, or until you reach her. She has very severe OSA. , AHI of 94 and saturation low of 60%. Please order CPAP machine Settings of Auto CPAP 5-20 cm H2O to get her on therapy asap.  She will then need an Overnight oxygen study on auto cpap with a down load. If she is not adequately controlled, she will need a CPAP titration study.  Thanks so much. Let me know if you have any questions.

## 2020-06-21 NOTE — Telephone Encounter (Signed)
-----   Message from Coralyn Helling, MD sent at 06/21/2020  8:25 AM EDT ----- Home sleep study from 06/02/20 showed very severe obstructive sleep apnea with AHI of 94.8 and SpO2 low of 60%.  Would recommend starting auto CPAP 5 to 20 cm H2O to get her on therapy quickly.  Then do overnight oximetry on auto CPAP and get CPAP download.  If she isn't adequately controlled, then set her up for CPAP titration study.  Thanks.  [place]

## 2020-06-21 NOTE — Addendum Note (Signed)
Addended by: Sheran Luz on: 06/21/2020 02:43 PM   Modules accepted: Orders

## 2020-09-05 ENCOUNTER — Other Ambulatory Visit: Payer: Self-pay

## 2020-09-05 ENCOUNTER — Ambulatory Visit: Payer: 59 | Admitting: Primary Care

## 2020-09-05 ENCOUNTER — Encounter: Payer: Self-pay | Admitting: Primary Care

## 2020-09-05 VITALS — BP 126/76 | HR 81 | Temp 97.5°F | Ht <= 58 in | Wt 166.5 lb

## 2020-09-05 DIAGNOSIS — M25561 Pain in right knee: Secondary | ICD-10-CM | POA: Diagnosis not present

## 2020-09-05 DIAGNOSIS — Z23 Encounter for immunization: Secondary | ICD-10-CM | POA: Diagnosis not present

## 2020-09-05 DIAGNOSIS — Z0000 Encounter for general adult medical examination without abnormal findings: Secondary | ICD-10-CM

## 2020-09-05 DIAGNOSIS — R739 Hyperglycemia, unspecified: Secondary | ICD-10-CM | POA: Diagnosis not present

## 2020-09-05 DIAGNOSIS — Z13220 Encounter for screening for lipoid disorders: Secondary | ICD-10-CM

## 2020-09-05 DIAGNOSIS — M25562 Pain in left knee: Secondary | ICD-10-CM

## 2020-09-05 DIAGNOSIS — G4733 Obstructive sleep apnea (adult) (pediatric): Secondary | ICD-10-CM

## 2020-09-05 DIAGNOSIS — G8929 Other chronic pain: Secondary | ICD-10-CM | POA: Diagnosis not present

## 2020-09-05 DIAGNOSIS — Z0001 Encounter for general adult medical examination with abnormal findings: Secondary | ICD-10-CM | POA: Insufficient documentation

## 2020-09-05 NOTE — Patient Instructions (Signed)
Stop by the lab prior to leaving today. I will notify you of your results once received.   Start exercising. You should be getting 150 minutes of exercise weekly.  Continue to work on a healthy diet.  Ensure you are consuming 64 ounces of water daily.  It was a pleasure to see you today!   Preventive Care 63-46 Years Old, Female Preventive care refers to visits with your health care provider and lifestyle choices that can promote health and wellness. This includes:  A yearly physical exam. This may also be called an annual well check.  Regular dental visits and eye exams.  Immunizations.  Screening for certain conditions.  Healthy lifestyle choices, such as eating a healthy diet, getting regular exercise, not using drugs or products that contain nicotine and tobacco, and limiting alcohol use. What can I expect for my preventive care visit? Physical exam Your health care provider will check your:  Height and weight. This may be used to calculate body mass index (BMI), which tells if you are at a healthy weight.  Heart rate and blood pressure.  Skin for abnormal spots. Counseling Your health care provider may ask you questions about your:  Alcohol, tobacco, and drug use.  Emotional well-being.  Home and relationship well-being.  Sexual activity.  Eating habits.  Work and work Astronomer.  Method of birth control.  Menstrual cycle.  Pregnancy history. What immunizations do I need?  Influenza (flu) vaccine  This is recommended every year. Tetanus, diphtheria, and pertussis (Tdap) vaccine  You may need a Td booster every 10 years. Varicella (chickenpox) vaccine  You may need this if you have not been vaccinated. Zoster (shingles) vaccine  You may need this after age 46. Measles, mumps, and rubella (MMR) vaccine  You may need at least one dose of MMR if you were born in 1957 or later. You may also need a second dose. Pneumococcal conjugate (PCV13)  vaccine  You may need this if you have certain conditions and were not previously vaccinated. Pneumococcal polysaccharide (PPSV23) vaccine  You may need one or two doses if you smoke cigarettes or if you have certain conditions. Meningococcal conjugate (MenACWY) vaccine  You may need this if you have certain conditions. Hepatitis A vaccine  You may need this if you have certain conditions or if you travel or work in places where you may be exposed to hepatitis A. Hepatitis B vaccine  You may need this if you have certain conditions or if you travel or work in places where you may be exposed to hepatitis B. Haemophilus influenzae type b (Hib) vaccine  You may need this if you have certain conditions. Human papillomavirus (HPV) vaccine  If recommended by your health care provider, you may need three doses over 6 months. You may receive vaccines as individual doses or as more than one vaccine together in one shot (combination vaccines). Talk with your health care provider about the risks and benefits of combination vaccines. What tests do I need? Blood tests  Lipid and cholesterol levels. These may be checked every 5 years, or more frequently if you are over 28 years old.  Hepatitis C test.  Hepatitis B test. Screening  Lung cancer screening. You may have this screening every year starting at age 70 if you have a 30-pack-year history of smoking and currently smoke or have quit within the past 15 years.  Colorectal cancer screening. All adults should have this screening starting at age 45 and continuing until age  43. Your health care provider may recommend screening at age 19 if you are at increased risk. You will have tests every 1-10 years, depending on your results and the type of screening test.  Diabetes screening. This is done by checking your blood sugar (glucose) after you have not eaten for a while (fasting). You may have this done every 1-3 years.  Mammogram. This may be  done every 1-2 years. Talk with your health care provider about when you should start having regular mammograms. This may depend on whether you have a family history of breast cancer.  BRCA-related cancer screening. This may be done if you have a family history of breast, ovarian, tubal, or peritoneal cancers.  Pelvic exam and Pap test. This may be done every 3 years starting at age 56. Starting at age 92, this may be done every 5 years if you have a Pap test in combination with an HPV test. Other tests  Sexually transmitted disease (STD) testing.  Bone density scan. This is done to screen for osteoporosis. You may have this scan if you are at high risk for osteoporosis. Follow these instructions at home: Eating and drinking  Eat a diet that includes fresh fruits and vegetables, whole grains, lean protein, and low-fat dairy.  Take vitamin and mineral supplements as recommended by your health care provider.  Do not drink alcohol if: ? Your health care provider tells you not to drink. ? You are pregnant, may be pregnant, or are planning to become pregnant.  If you drink alcohol: ? Limit how much you have to 0-1 drink a day. ? Be aware of how much alcohol is in your drink. In the [place] one drink equals one 12 oz bottle of beer (355 mL), one 5 oz glass of wine (148 mL), or one 1 oz glass of hard liquor (44 mL). Lifestyle  Take daily care of your teeth and gums.  Stay active. Exercise for at least 30 minutes on 5 or more days each week.  Do not use any products that contain nicotine or tobacco, such as cigarettes, e-cigarettes, and chewing tobacco. If you need help quitting, ask your health care provider.  If you are sexually active, practice safe sex. Use a condom or other form of birth control (contraception) in order to prevent pregnancy and STIs (sexually transmitted infections).  If told by your health care provider, take low-dose aspirin daily starting at age 46. What's  next?  Visit your health care provider once a year for a well check visit.  Ask your health care provider how often you should have your eyes and teeth checked.  Stay up to date on all vaccines. This information is not intended to replace advice given to you by your health care provider. Make sure you discuss any questions you have with your health care provider. Document Revised: 07/01/2018 Document Reviewed: 07/01/2018 Elsevier Patient Education  2020 [employer].

## 2020-09-05 NOTE — Progress Notes (Signed)
Subjective:    Patient ID: Gloria Wade, female    DOB: Apr 13, 1976, 44 y.o.   MRN: [id number]  HPI  This visit occurred during the SARS-CoV-2 public health emergency.  Safety protocols were in place, including screening questions prior to the visit, additional usage of staff PPE, and extensive cleaning of exam room while observing appropriate contact time as indicated for disinfecting solutions.   Ms. Gloria Wade is a 44 year old female who presents today for complete physical.  Immunizations: -Tetanus: Unsure, will give today. -Influenza: Declines  -Covid-19: Completes   Diet: She endorses a healthy diet.  Exercise: She is walking some  Eye exam: Completed in 2021 Dental exam: Completes annually   Pap Smear: Completed in 2020, then complete hysterectomy  Mammogram: Completed in 2020, declines this year, would like to defer until next year. Colonoscopy: Never completed, declines today, would like to defer until next year.  BP Readings from Last 3 Encounters:  09/05/20 126/76  03/21/20 106/72  02/08/20 108/60   Wt Readings from Last 3 Encounters:  09/05/20 166 lb 8 oz (75.5 kg)  03/21/20 177 lb 9.6 oz (80.6 kg)  02/08/20 174 lb 8 oz (79.2 kg)     Review of Systems  Constitutional: Negative for unexpected weight change.  HENT: Negative for rhinorrhea.   Respiratory: Negative for cough and shortness of breath.   Cardiovascular: Negative for chest pain.  Gastrointestinal: Negative for constipation and diarrhea.  Genitourinary: Negative for difficulty urinating.  Musculoskeletal: Negative for arthralgias and myalgias.  Skin: Negative for rash.  Allergic/Immunologic: Negative for environmental allergies.  Neurological: Negative for dizziness, numbness and headaches.  Psychiatric/Behavioral: The patient is not nervous/anxious.        No past medical history on file.   Social History   Socioeconomic History  . Marital status: Married    Spouse name: Not on file  . Number  of children: Not on file  . Years of education: Not on file  . Highest education level: Not on file  Occupational History  . Not on file  Tobacco Use  . Smoking status: Never Smoker  . Smokeless tobacco: Never Used  Substance and Sexual Activity  . Alcohol use: No  . Drug use: Not on file  . Sexual activity: Not on file  Other Topics Concern  . Not on file  Social History Narrative  . Not on file   Social Determinants of Health   Financial Resource Strain:   . Difficulty of Paying Living Expenses: Not on file  Food Insecurity:   . Worried About Programme researcher, broadcasting/film/video in the Last Year: Not on file  . Ran Out of Food in the Last Year: Not on file  Transportation Needs:   . Lack of Transportation (Medical): Not on file  . Lack of Transportation (Non-Medical): Not on file  Physical Activity:   . Days of Exercise per Week: Not on file  . Minutes of Exercise per Session: Not on file  Stress:   . Feeling of Stress : Not on file  Social Connections:   . Frequency of Communication with Friends and Family: Not on file  . Frequency of Social Gatherings with Friends and Family: Not on file  . Attends Religious Services: Not on file  . Active Member of Clubs or Organizations: Not on file  . Attends Banker Meetings: Not on file  . Marital Status: Not on file  Intimate Partner Violence:   . Fear of Current or Ex-Partner: Not  on file  . Emotionally Abused: Not on file  . Physically Abused: Not on file  . Sexually Abused: Not on file    No past surgical history on file.  No family history on file.  No Known Allergies  Current Outpatient Medications on File Prior to Visit  Medication Sig Dispense Refill  . acetaminophen (TYLENOL) 325 MG tablet Take by mouth.    . Aspirin-Salicylamide-Caffeine (ARTHRITIS STRENGTH BC POWDER PO) Take by mouth.    Marland Kitchen ibuprofen (ADVIL) 600 MG tablet Take by mouth.     No current facility-administered medications on file prior to visit.     BP 126/76 (BP Location: Left Arm, Patient Position: Sitting)   Pulse 81   Temp (!) 97.5 F (36.4 C)   Ht 4' 9.01" (1.448 m)   Wt 166 lb 8 oz (75.5 kg)   LMP 11/18/2016   SpO2 99%   BMI 36.02 kg/m    Objective:   Physical Exam HENT:     Right Ear: Tympanic membrane and ear canal normal.     Left Ear: Tympanic membrane and ear canal normal.  Eyes:     Pupils: Pupils are equal, round, and reactive to light.  Cardiovascular:     Rate and Rhythm: Normal rate and regular rhythm.  Pulmonary:     Effort: Pulmonary effort is normal.     Breath sounds: Normal breath sounds.  Abdominal:     General: Bowel sounds are normal.     Palpations: Abdomen is soft.     Tenderness: There is no abdominal tenderness.  Musculoskeletal:        General: Normal range of motion.     Cervical back: Neck supple.  Skin:    General: Skin is warm and dry.  Neurological:     Mental Status: She is alert and oriented to person, place, and time.     Cranial Nerves: No cranial nerve deficit.     Deep Tendon Reflexes:     Reflex Scores:      Patellar reflexes are 2+ on the right side and 2+ on the left side. Psychiatric:        Mood and Affect: Mood normal.            Assessment & Plan:

## 2020-09-05 NOTE — Assessment & Plan Note (Addendum)
Chronic and continued.  Following with rheumatology who recommend weight loss. She will follow up with them in 6 months. Negative labs for RA.  Add uric acid level today given family history in her father.

## 2020-09-05 NOTE — Assessment & Plan Note (Signed)
Tetanus due, provided today. Declines influenza vaccinations. Hysterectomy. Mammogram last done in 2020, she declines this year and will repeat again in 2022. Colonoscopy due, she kindly declines and will defer until next year.  Discussed the importance of a healthy diet and regular exercise in order for weight loss, and to reduce the risk of any potential medical problems.  Exam today stable. Labs pending.

## 2020-09-05 NOTE — Assessment & Plan Note (Signed)
Has CPAP but is unable to tolerate.  She will notify pulmonology.

## 2020-09-06 LAB — URIC ACID: Uric Acid, Serum: 8 mg/dL — ABNORMAL HIGH (ref 2.4–7.0)

## 2020-09-06 LAB — LIPID PANEL
Cholesterol: 245 mg/dL — ABNORMAL HIGH (ref 0–200)
HDL: 35.3 mg/dL — ABNORMAL LOW (ref >39.00)
NonHDL: 209.6
Total CHOL/HDL Ratio: 7
Triglycerides: 313 mg/dL — ABNORMAL HIGH (ref 0.0–149.0)
VLDL: 62.6 mg/dL — ABNORMAL HIGH (ref 0.0–40.0)

## 2020-09-06 LAB — CBC
HCT: 40.6 % (ref 36.0–46.0)
Hemoglobin: 13 g/dL (ref 12.0–15.0)
MCHC: 32.1 g/dL (ref 30.0–36.0)
MCV: 79.5 fl (ref 78.0–100.0)
Platelets: 307 10*3/uL (ref 150.0–400.0)
RBC: 5.11 Mil/uL (ref 3.87–5.11)
RDW: 13.8 % (ref 11.5–15.5)
WBC: 9.7 10*3/uL (ref 4.0–10.5)

## 2020-09-06 LAB — LDL CHOLESTEROL, DIRECT: Direct LDL: 167 mg/dL

## 2020-09-06 LAB — HEMOGLOBIN A1C: Hgb A1c MFr Bld: 6.7 % — ABNORMAL HIGH (ref 4.6–6.5)

## 2020-09-11 ENCOUNTER — Other Ambulatory Visit: Payer: Self-pay

## 2020-09-11 MED ORDER — METFORMIN HCL ER 500 MG PO TB24: 500.0000 mg | ORAL_TABLET | Freq: Every day | ORAL | 1 refills | Status: DC

## 2020-09-11 MED ORDER — ROSUVASTATIN CALCIUM 5 MG PO TABS: 5.0000 mg | ORAL_TABLET | Freq: Every day | ORAL | 1 refills | Status: DC

## 2020-09-12 ENCOUNTER — Telehealth: Payer: Self-pay | Admitting: Acute Care

## 2020-09-12 NOTE — Telephone Encounter (Signed)
I tried to call patient to schedule a visit with Maralyn Sago on Monday, 09/17/2020 to go over ONO results. There was no answer, left VM to callback.

## 2020-12-17 ENCOUNTER — Encounter: Payer: Self-pay | Admitting: Primary Care

## 2020-12-17 ENCOUNTER — Ambulatory Visit (INDEPENDENT_AMBULATORY_CARE_PROVIDER_SITE_OTHER): Payer: 59 | Admitting: Primary Care

## 2020-12-17 ENCOUNTER — Other Ambulatory Visit: Payer: Self-pay

## 2020-12-17 VITALS — BP 126/78 | HR 86 | Temp 97.6°F | Ht <= 58 in | Wt 171.0 lb

## 2020-12-17 DIAGNOSIS — E785 Hyperlipidemia, unspecified: Secondary | ICD-10-CM

## 2020-12-17 DIAGNOSIS — R739 Hyperglycemia, unspecified: Secondary | ICD-10-CM | POA: Diagnosis not present

## 2020-12-17 DIAGNOSIS — E1165 Type 2 diabetes mellitus with hyperglycemia: Secondary | ICD-10-CM | POA: Insufficient documentation

## 2020-12-17 DIAGNOSIS — E119 Type 2 diabetes mellitus without complications: Secondary | ICD-10-CM | POA: Insufficient documentation

## 2020-12-17 LAB — POCT GLYCOSYLATED HEMOGLOBIN (HGB A1C): Hemoglobin A1C: 5.7 % — AB (ref 4.0–5.6)

## 2020-12-17 NOTE — Assessment & Plan Note (Signed)
Compliant to rosuvastatin 5 mg which was initiated for LDL of 167. Repeat lipid panel and LFT's pending.   Continue rosuvastatin 5 mg.

## 2020-12-17 NOTE — Assessment & Plan Note (Signed)
New diagnosis as of November 2021 with A1C of 6.7. improved today on Metformin XR 500 mg daily with A1C of 5.7.  Continue Metformin XR 500 mg daily. Continue rosuvastatin 5 mg daily. Urine microalbumin due and pending.  Encouraged weight loss and healthy diet.   Follow up in 6 months.

## 2020-12-17 NOTE — Progress Notes (Signed)
Subjective:    Patient ID: Rosina Lowenstein, female    DOB: 01-23-1976, 45 y.o.   MRN: [id number]  HPI  This visit occurred during the SARS-CoV-2 public health emergency.  Safety protocols were in place, including screening questions prior to the visit, additional usage of staff PPE, and extensive cleaning of exam room while observing appropriate contact time as indicated for disinfecting solutions.   Ms. Lipson is a 45 year old female with a history of type 2 diabetes, hyperlipidemia, OSA who presents today for follow up of diabetes.  Current medications include: Metformin XR 500 mg daily. She denies diarrhea, GI upset. She has been compliant daily.   She is active at work, no regular exercise. No changes in diet.   Last A1C: 6.7 in November 2021, 5.7 today Last Eye Exam: Due Last Foot Exam: Due Pneumonia Vaccination: Due ACE/ARB: None. Urine microalbumin due Statin: Crestor 5 mg  BP Readings from Last 3 Encounters:  12/17/20 126/78  09/05/20 126/76  03/21/20 106/72    Wt Readings from Last 3 Encounters:  12/17/20 171 lb (77.6 kg)  09/05/20 166 lb 8 oz (75.5 kg)  03/21/20 177 lb 9.6 oz (80.6 kg)     Review of Systems  Respiratory: Negative for shortness of breath.   Cardiovascular: Negative for chest pain.  Neurological: Negative for numbness and headaches.        History reviewed. No pertinent past medical history.   Social History   Socioeconomic History  . Marital status: Married    Spouse name: Not on file  . Number of children: Not on file  . Years of education: Not on file  . Highest education level: Not on file  Occupational History  . Not on file  Tobacco Use  . Smoking status: Never Smoker  . Smokeless tobacco: Never Used  Substance and Sexual Activity  . Alcohol use: No  . Drug use: Not on file  . Sexual activity: Not on file  Other Topics Concern  . Not on file  Social History Narrative  . Not on file   Social Determinants of Health    Financial Resource Strain: Not on file  Food Insecurity: Not on file  Transportation Needs: Not on file  Physical Activity: Not on file  Stress: Not on file  Social Connections: Not on file  Intimate Partner Violence: Not on file    History reviewed. No pertinent surgical history.  History reviewed. No pertinent family history.  No Known Allergies  Current Outpatient Medications on File Prior to Visit  Medication Sig Dispense Refill  . acetaminophen (TYLENOL) 325 MG tablet Take by mouth.    . Aspirin-Salicylamide-Caffeine (ARTHRITIS STRENGTH BC POWDER PO) Take by mouth.    Marland Kitchen ibuprofen (ADVIL) 600 MG tablet Take by mouth.    . metFORMIN (GLUCOPHAGE XR) 500 MG 24 hr tablet Take 1 tablet (500 mg total) by mouth daily with breakfast. 90 tablet 1  . rosuvastatin (CRESTOR) 5 MG tablet Take 1 tablet (5 mg total) by mouth daily. 90 tablet 1   No current facility-administered medications on file prior to visit.    BP 126/78   Pulse 86   Temp 97.6 F (36.4 C) (Temporal)   Ht 4\' 9"  (1.448 m)   Wt 171 lb (77.6 kg)   LMP 11/18/2016   SpO2 97%   BMI 37.00 kg/m    Objective:   Physical Exam Constitutional:      Appearance: She is well-nourished.  Cardiovascular:  Rate and Rhythm: Normal rate and regular rhythm.  Pulmonary:     Effort: Pulmonary effort is normal.     Breath sounds: Normal breath sounds.  Musculoskeletal:     Cervical back: Neck supple.  Skin:    General: Skin is warm and dry.  Psychiatric:        Mood and Affect: Mood and affect and mood normal.            Assessment & Plan:

## 2020-12-17 NOTE — Patient Instructions (Signed)
Stop by the lab prior to leaving today. I will notify you of your results once received.   Continue metformin XR 500 mg once daily for diabetes.  Start exercising. You should be getting 150 minutes of moderate intensity exercise weekly.  It is important that you improve your diet. Please limit carbohydrates in the form of white bread, rice, pasta, sweets, fast food, fried food, sugary drinks, etc. Increase your consumption of fresh fruits and vegetables, whole grains, lean protein.  Ensure you are consuming 64 ounces of water daily.  Please schedule a follow up appointment in 6 months.   It was a pleasure to see you today!   Diabetes Mellitus and Nutrition, Adult When you have diabetes, or diabetes mellitus, it is very important to have healthy eating habits because your blood sugar (glucose) levels are greatly affected by what you eat and drink. Eating healthy foods in the right amounts, at about the same times every day, can help you:  Control your blood glucose.  Lower your risk of heart disease.  Improve your blood pressure.  Reach or maintain a healthy weight. What can affect my meal plan? Every person with diabetes is different, and each person has different needs for a meal plan. Your health care provider may recommend that you work with a dietitian to make a meal plan that is best for you. Your meal plan may vary depending on factors such as:  The calories you need.  The medicines you take.  Your weight.  Your blood glucose, blood pressure, and cholesterol levels.  Your activity level.  Other health conditions you have, such as heart or kidney disease. How do carbohydrates affect me? Carbohydrates, also called carbs, affect your blood glucose level more than any other type of food. Eating carbs naturally raises the amount of glucose in your blood. Carb counting is a method for keeping track of how many carbs you eat. Counting carbs is important to keep your blood  glucose at a healthy level, especially if you use insulin or take certain oral diabetes medicines. It is important to know how many carbs you can safely have in each meal. This is different for every person. Your dietitian can help you calculate how many carbs you should have at each meal and for each snack. How does alcohol affect me? Alcohol can cause a sudden decrease in blood glucose (hypoglycemia), especially if you use insulin or take certain oral diabetes medicines. Hypoglycemia can be a life-threatening condition. Symptoms of hypoglycemia, such as sleepiness, dizziness, and confusion, are similar to symptoms of having too much alcohol.  Do not drink alcohol if: ? Your health care provider tells you not to drink. ? You are pregnant, may be pregnant, or are planning to become pregnant.  If you drink alcohol: ? Do not drink on an empty stomach. ? Limit how much you use to:  0-1 drink a day for women.  0-2 drinks a day for men. ? Be aware of how much alcohol is in your drink. In the [place] one drink equals one 12 oz bottle of beer (355 mL), one 5 oz glass of wine (148 mL), or one 1 oz glass of hard liquor (44 mL). ? Keep yourself hydrated with water, diet soda, or unsweetened iced tea.  Keep in mind that regular soda, juice, and other mixers may contain a lot of sugar and must be counted as carbs. What are tips for following this plan? Reading food labels  Start by checking the  serving size on the "Nutrition Facts" label of packaged foods and drinks. The amount of calories, carbs, fats, and other nutrients listed on the label is based on one serving of the item. Many items contain more than one serving per package.  Check the total grams (g) of carbs in one serving. You can calculate the number of servings of carbs in one serving by dividing the total carbs by 15. For example, if a food has 30 g of total carbs per serving, it would be equal to 2 servings of carbs.  Check the number of  grams (g) of saturated fats and trans fats in one serving. Choose foods that have a low amount or none of these fats.  Check the number of milligrams (mg) of salt (sodium) in one serving. Most people should limit total sodium intake to less than 2,300 mg per day.  Always check the nutrition information of foods labeled as "low-fat" or "nonfat." These foods may be higher in added sugar or refined carbs and should be avoided.  Talk to your dietitian to identify your daily goals for nutrients listed on the label. Shopping  Avoid buying canned, pre-made, or processed foods. These foods tend to be high in fat, sodium, and added sugar.  Shop around the outside edge of the grocery store. This is where you will most often find fresh fruits and vegetables, bulk grains, fresh meats, and fresh dairy. Cooking  Use low-heat cooking methods, such as baking, instead of high-heat cooking methods like deep frying.  Cook using healthy oils, such as olive, canola, or sunflower oil.  Avoid cooking with butter, cream, or high-fat meats. Meal planning  Eat meals and snacks regularly, preferably at the same times every day. Avoid going long periods of time without eating.  Eat foods that are high in fiber, such as fresh fruits, vegetables, beans, and whole grains. Talk with your dietitian about how many servings of carbs you can eat at each meal.  Eat 4-6 oz (112-168 g) of lean protein each day, such as lean meat, chicken, fish, eggs, or tofu. One ounce (oz) of lean protein is equal to: ? 1 oz (28 g) of meat, chicken, or fish. ? 1 egg. ?  cup (62 g) of tofu.  Eat some foods each day that contain healthy fats, such as avocado, nuts, seeds, and fish.   What foods should I eat? Fruits Berries. Apples. Oranges. Peaches. Apricots. Plums. Grapes. Mango. Papaya. Pomegranate. Kiwi. Cherries. Vegetables Lettuce. Spinach. Leafy greens, including kale, chard, collard greens, and mustard greens. Beets. Cauliflower.  Cabbage. Broccoli. Carrots. Green beans. Tomatoes. Peppers. Onions. Cucumbers. Brussels sprouts. Grains Whole grains, such as whole-wheat or whole-grain bread, crackers, tortillas, cereal, and pasta. Unsweetened oatmeal. Quinoa. Brown or wild rice. Meats and other proteins Seafood. Poultry without skin. Lean cuts of poultry and beef. Tofu. Nuts. Seeds. Dairy Low-fat or fat-free dairy products such as milk, yogurt, and cheese. The items listed above may not be a complete list of foods and beverages you can eat. Contact a dietitian for more information. What foods should I avoid? Fruits Fruits canned with syrup. Vegetables Canned vegetables. Frozen vegetables with butter or cream sauce. Grains Refined white flour and flour products such as bread, pasta, snack foods, and cereals. Avoid all processed foods. Meats and other proteins Fatty cuts of meat. Poultry with skin. Breaded or fried meats. Processed meat. Avoid saturated fats. Dairy Full-fat yogurt, cheese, or milk. Beverages Sweetened drinks, such as soda or iced tea. The items  listed above may not be a complete list of foods and beverages you should avoid. Contact a dietitian for more information. Questions to ask a health care provider  Do I need to meet with a diabetes educator?  Do I need to meet with a dietitian?  What number can I call if I have questions?  When are the best times to check my blood glucose? Where to find more information:  American Diabetes Association: diabetes.org  Academy of Nutrition and Dietetics: www.eatright.[employer] of Diabetes and Digestive and Kidney Diseases: CarFlippers.tn  Association of Diabetes Care and Education Specialists: www.diabeteseducator.org Summary  It is important to have healthy eating habits because your blood sugar (glucose) levels are greatly affected by what you eat and drink.  A healthy meal plan will help you control your blood glucose and maintain  a healthy lifestyle.  Your health care provider may recommend that you work with a dietitian to make a meal plan that is best for you.  Keep in mind that carbohydrates (carbs) and alcohol have immediate effects on your blood glucose levels. It is important to count carbs and to use alcohol carefully. This information is not intended to replace advice given to you by your health care provider. Make sure you discuss any questions you have with your health care provider. Document Revised: 09/27/2019 Document Reviewed: 09/27/2019 Elsevier Patient Education  2021 [employer].

## 2020-12-18 LAB — LIPID PANEL
Cholesterol: 237 mg/dL — ABNORMAL HIGH (ref 0–200)
HDL: 47.6 mg/dL (ref >39.00)
LDL Cholesterol: 158 mg/dL — ABNORMAL HIGH (ref 0–99)
NonHDL: 189.24
Total CHOL/HDL Ratio: 5
Triglycerides: 155 mg/dL — ABNORMAL HIGH (ref 0.0–149.0)
VLDL: 31 mg/dL (ref 0.0–40.0)

## 2020-12-18 LAB — CBC
HCT: 40 % (ref 36.0–46.0)
Hemoglobin: 12.9 g/dL (ref 12.0–15.0)
MCHC: 32.3 g/dL (ref 30.0–36.0)
MCV: 80.6 fl (ref 78.0–100.0)
Platelets: 254 10*3/uL (ref 150.0–400.0)
RBC: 4.96 Mil/uL (ref 3.87–5.11)
RDW: 14.1 % (ref 11.5–15.5)
WBC: 7.6 10*3/uL (ref 4.0–10.5)

## 2020-12-18 LAB — MICROALBUMIN / CREATININE URINE RATIO
Creatinine,U: 44.8 mg/dL
Microalb Creat Ratio: 3.8 mg/g (ref 0.0–30.0)
Microalb, Ur: 1.7 mg/dL (ref 0.0–1.9)

## 2020-12-18 LAB — HEPATIC FUNCTION PANEL
ALT: 24 U/L (ref 0–35)
AST: 21 U/L (ref 0–37)
Albumin: 4 g/dL (ref 3.5–5.2)
Alkaline Phosphatase: 42 U/L (ref 39–117)
Bilirubin, Direct: 0.1 mg/dL (ref 0.0–0.3)
Total Bilirubin: 0.5 mg/dL (ref 0.2–1.2)
Total Protein: 7.6 g/dL (ref 6.0–8.3)

## 2020-12-19 IMAGING — DX DG KNEE COMPLETE 4+V*L*
4 series · 4 of 4 positions shown · non-contrast
Comparison: None.

CLINICAL DATA: Chronic knee pain

EXAM:
LEFT KNEE - COMPLETE 4+ VIEW

[knee ap]
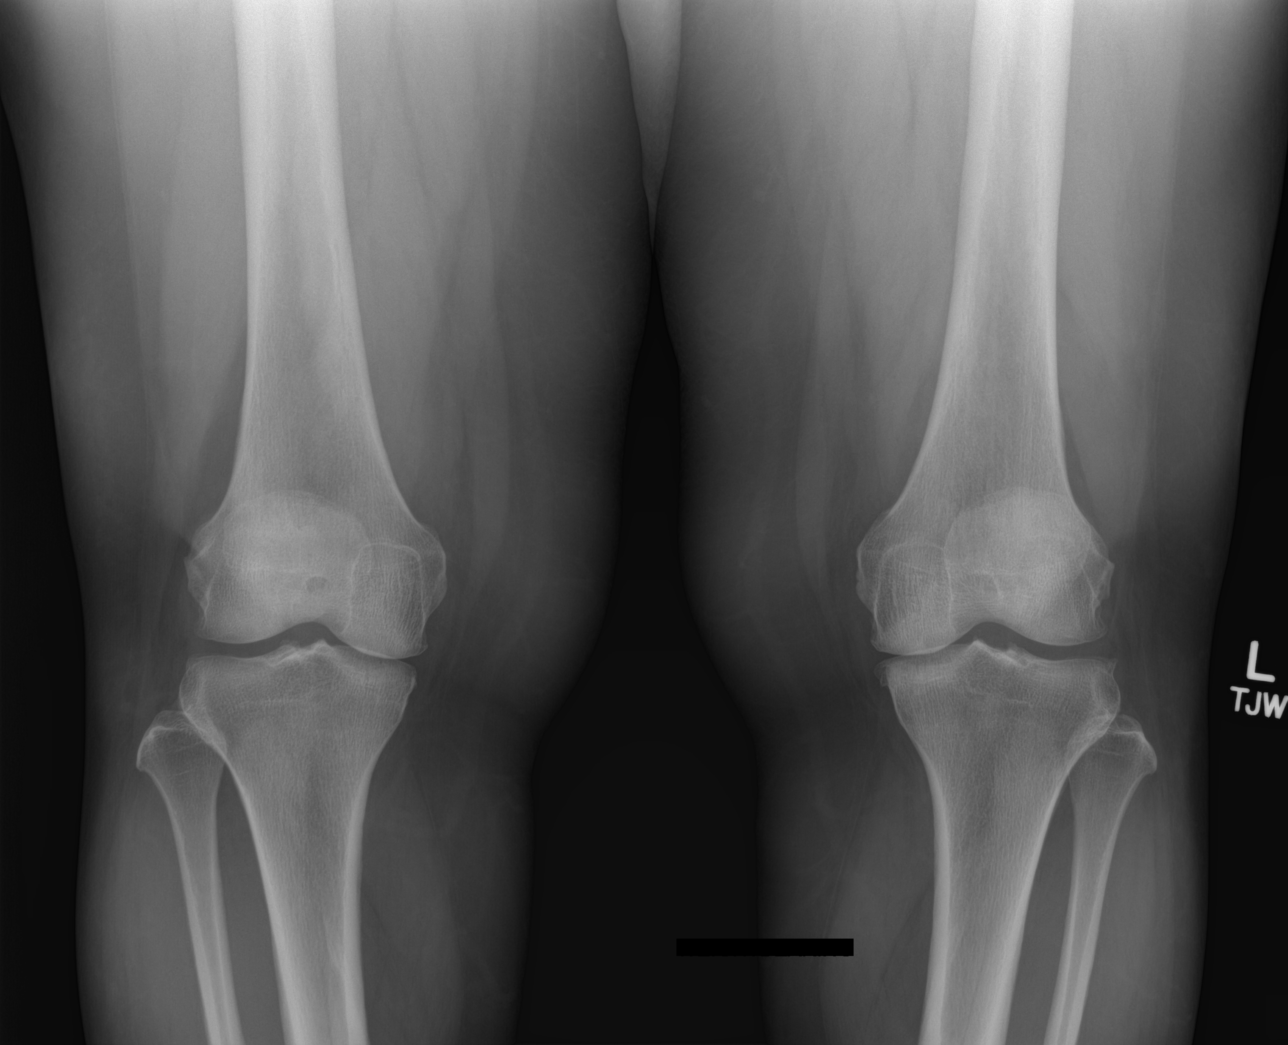

[knee tunnel]
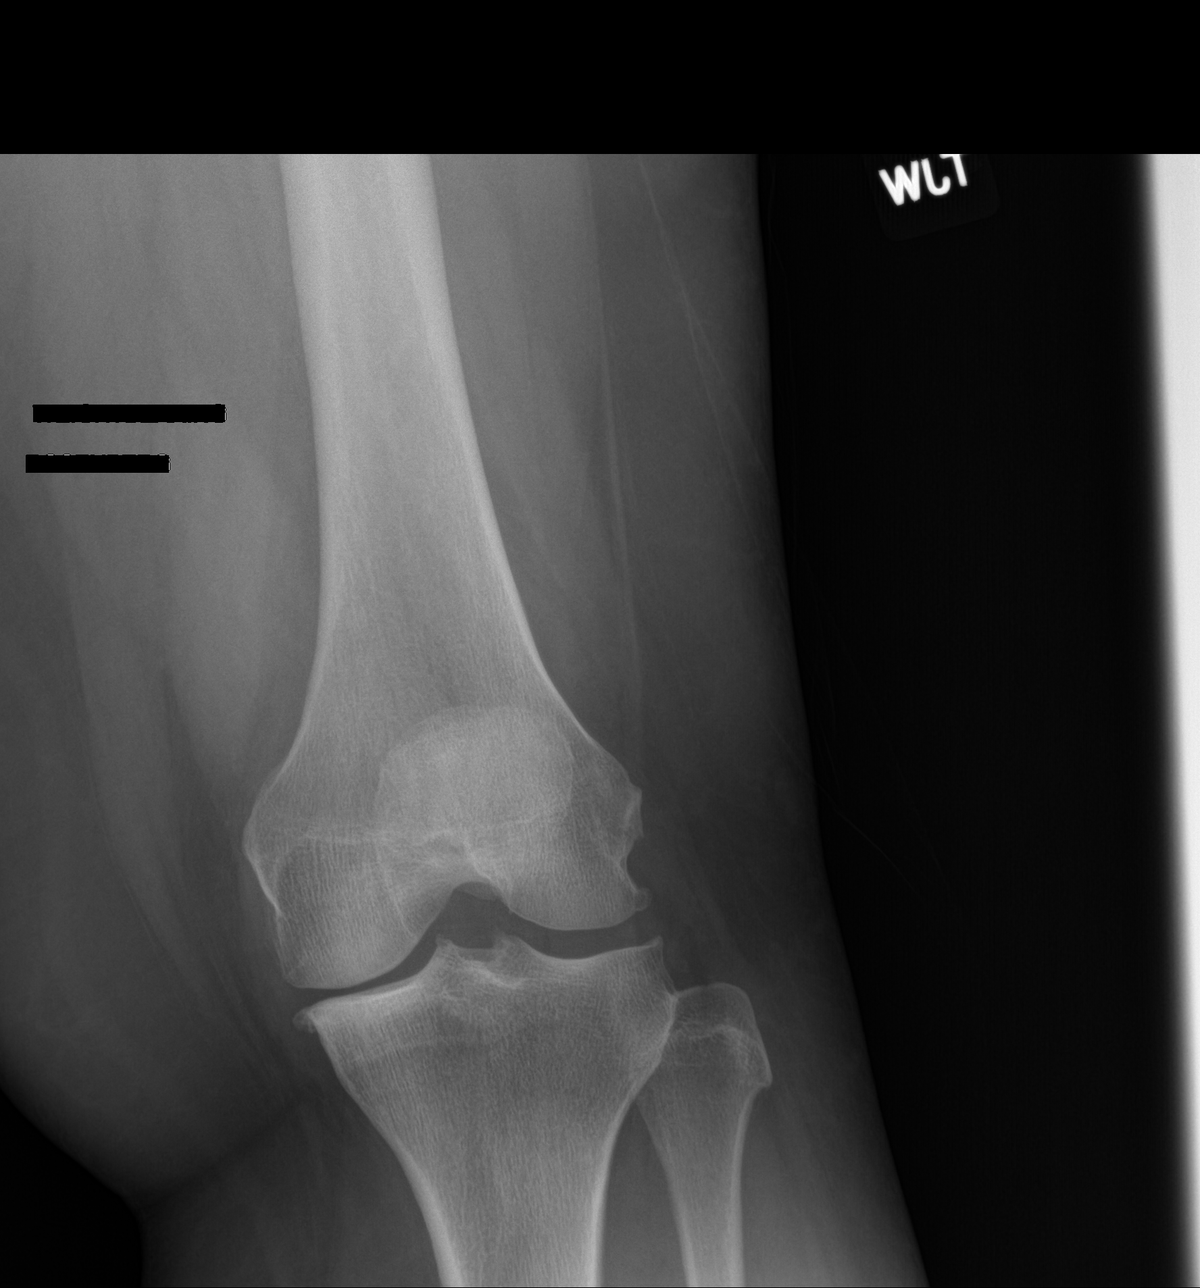

[knee lat]
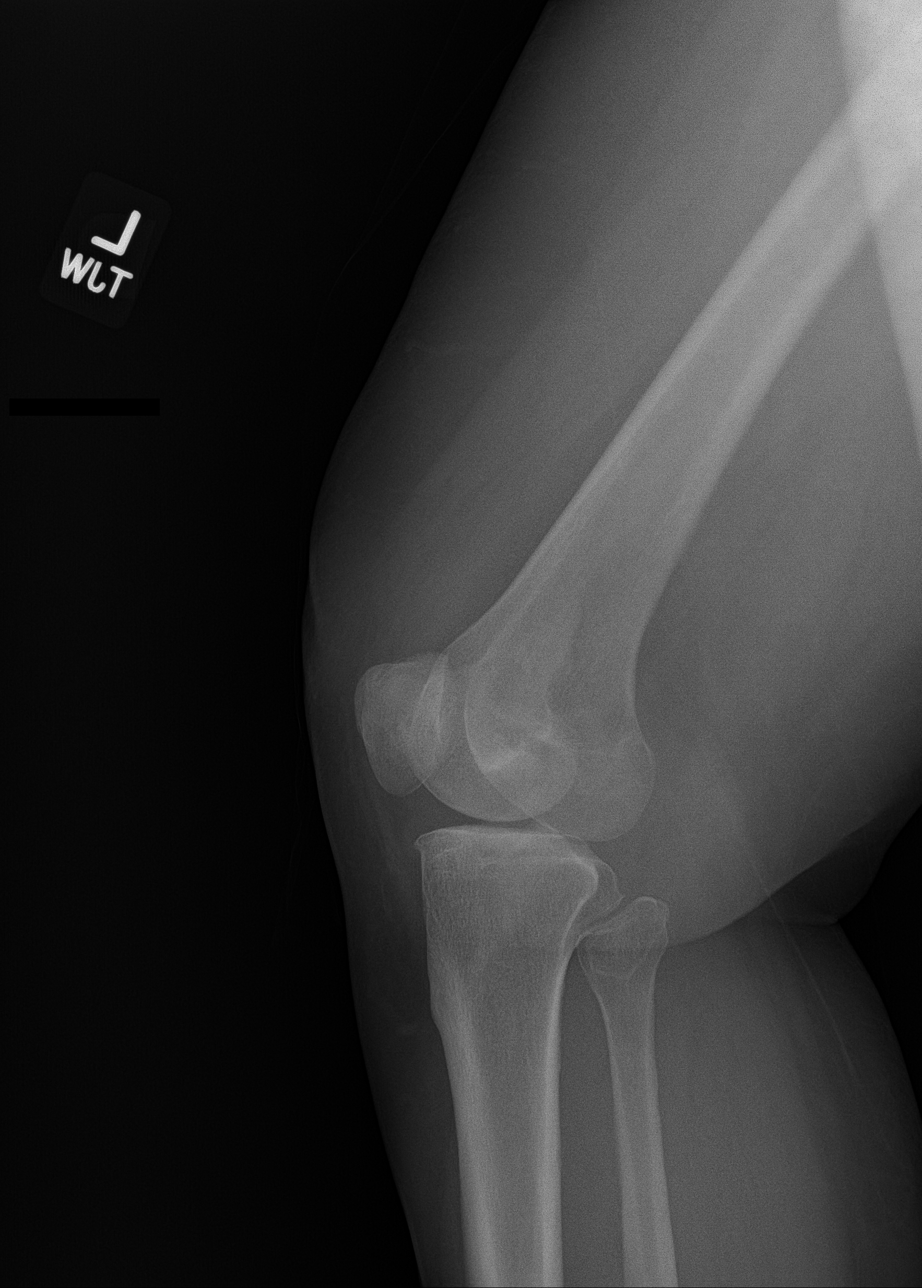

[patella skyline]
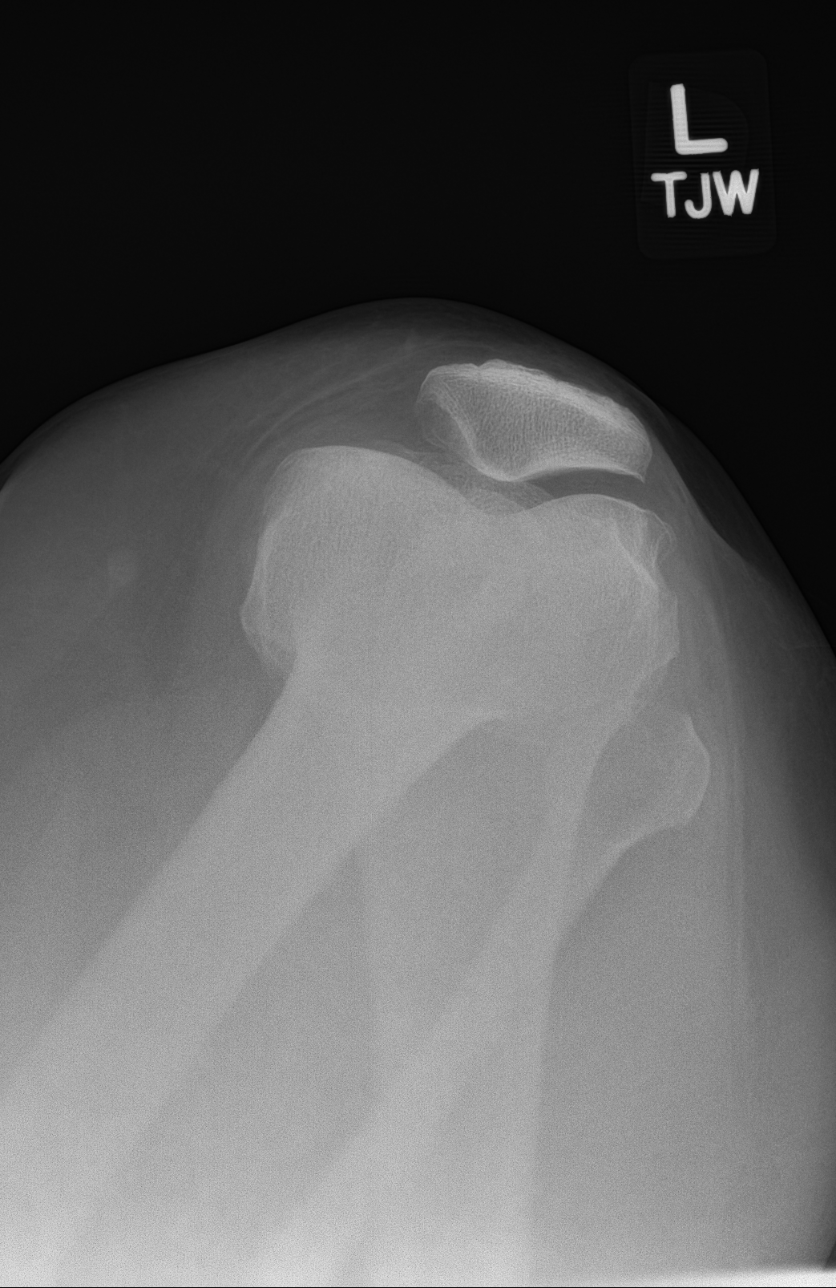

[4 of 4 positions shown; findings below may reference images not displayed]

FINDINGS: No fracture or malalignment. Mild to moderate medial joint space
narrowing with spurring. Mild lateral degenerative spurring.
Possible small knee effusion.
IMPRESSION: Mild to moderate arthritis of the knee with possible small effusion

## 2021-03-22 ENCOUNTER — Other Ambulatory Visit: Payer: Self-pay | Admitting: Primary Care

## 2021-03-22 DIAGNOSIS — E119 Type 2 diabetes mellitus without complications: Secondary | ICD-10-CM

## 2021-03-22 DIAGNOSIS — E785 Hyperlipidemia, unspecified: Secondary | ICD-10-CM

## 2021-06-25 ENCOUNTER — Ambulatory Visit: Payer: 59 | Admitting: Primary Care

## 2021-06-27 ENCOUNTER — Telehealth: Payer: Self-pay | Admitting: Primary Care

## 2021-06-27 NOTE — Telephone Encounter (Signed)
LMTCB to schedule another appt since she had a No Show on 06/25/21

## 2021-09-25 ENCOUNTER — Telehealth: Payer: Self-pay | Admitting: Primary Care

## 2021-09-25 DIAGNOSIS — E119 Type 2 diabetes mellitus without complications: Secondary | ICD-10-CM

## 2021-09-25 DIAGNOSIS — E785 Hyperlipidemia, unspecified: Secondary | ICD-10-CM

## 2021-09-25 NOTE — Telephone Encounter (Signed)
Please call patient to schedule, she is overdue for follow up. Her father, our patient as well, is also overdue and does not speak [place]. Will you call to get both scheduled?

## 2021-10-08 NOTE — Telephone Encounter (Signed)
Called someone picked up then hung up. Called back and sent to voice mail. Not able to leave message voicemail full.

## 2021-10-14 NOTE — Telephone Encounter (Signed)
CALLED PATIENT AND VM IS FULL

## 2021-10-14 NOTE — Telephone Encounter (Signed)
Please call to make follow up in office.

## 2021-10-15 ENCOUNTER — Encounter: Payer: Self-pay | Admitting: Primary Care

## 2021-10-15 NOTE — Telephone Encounter (Signed)
2nd attempt  VM full, letter mailed

## 2021-10-25 ENCOUNTER — Other Ambulatory Visit: Payer: Self-pay | Admitting: Primary Care

## 2021-10-25 DIAGNOSIS — E119 Type 2 diabetes mellitus without complications: Secondary | ICD-10-CM

## 2021-10-25 DIAGNOSIS — E785 Hyperlipidemia, unspecified: Secondary | ICD-10-CM

## 2022-01-07 ENCOUNTER — Other Ambulatory Visit: Payer: Self-pay | Admitting: Primary Care

## 2022-01-07 ENCOUNTER — Ambulatory Visit (INDEPENDENT_AMBULATORY_CARE_PROVIDER_SITE_OTHER): Payer: Self-pay | Admitting: Primary Care

## 2022-01-07 ENCOUNTER — Other Ambulatory Visit: Payer: Self-pay

## 2022-01-07 ENCOUNTER — Encounter: Payer: Self-pay | Admitting: Primary Care

## 2022-01-07 VITALS — BP 128/78 | HR 88 | Temp 98.6°F | Ht <= 58 in | Wt 177.0 lb

## 2022-01-07 DIAGNOSIS — E785 Hyperlipidemia, unspecified: Secondary | ICD-10-CM

## 2022-01-07 DIAGNOSIS — E559 Vitamin D deficiency, unspecified: Secondary | ICD-10-CM

## 2022-01-07 DIAGNOSIS — E119 Type 2 diabetes mellitus without complications: Secondary | ICD-10-CM

## 2022-01-07 DIAGNOSIS — G4733 Obstructive sleep apnea (adult) (pediatric): Secondary | ICD-10-CM

## 2022-01-07 DIAGNOSIS — R5383 Other fatigue: Secondary | ICD-10-CM | POA: Insufficient documentation

## 2022-01-07 LAB — VITAMIN B12: Vitamin B-12: 752 pg/mL (ref 211–911)

## 2022-01-07 LAB — COMPREHENSIVE METABOLIC PANEL
ALT: 16 U/L (ref 0–35)
AST: 18 U/L (ref 0–37)
Albumin: 4.2 g/dL (ref 3.5–5.2)
Alkaline Phosphatase: 42 U/L (ref 39–117)
BUN: 13 mg/dL (ref 6–23)
CO2: 29 mEq/L (ref 19–32)
Calcium: 9.3 mg/dL (ref 8.4–10.5)
Chloride: 101 mEq/L (ref 96–112)
Creatinine, Ser: 0.72 mg/dL (ref 0.40–1.20)
GFR: 99.39 mL/min (ref >60.00)
Glucose, Bld: 108 mg/dL — ABNORMAL HIGH (ref 70–99)
Potassium: 3.7 mEq/L (ref 3.5–5.1)
Sodium: 138 mEq/L (ref 135–145)
Total Bilirubin: 0.2 mg/dL (ref 0.2–1.2)
Total Protein: 6.9 g/dL (ref 6.0–8.3)

## 2022-01-07 LAB — LIPID PANEL
Cholesterol: 209 mg/dL — ABNORMAL HIGH (ref 0–200)
HDL: 48.8 mg/dL (ref >39.00)
NonHDL: 160.61
Total CHOL/HDL Ratio: 4
Triglycerides: 321 mg/dL — ABNORMAL HIGH (ref 0.0–149.0)
VLDL: 64.2 mg/dL — ABNORMAL HIGH (ref 0.0–40.0)

## 2022-01-07 LAB — TSH: TSH: 1.31 u[IU]/mL (ref 0.35–5.50)

## 2022-01-07 LAB — POCT GLYCOSYLATED HEMOGLOBIN (HGB A1C): Hemoglobin A1C: 5.7 % — AB (ref 4.0–5.6)

## 2022-01-07 LAB — VITAMIN D 25 HYDROXY (VIT D DEFICIENCY, FRACTURES): VITD: 17.36 ng/mL — ABNORMAL LOW (ref 30.00–100.00)

## 2022-01-07 LAB — CBC
HCT: 38.5 % (ref 36.0–46.0)
Hemoglobin: 12.6 g/dL (ref 12.0–15.0)
MCHC: 32.7 g/dL (ref 30.0–36.0)
MCV: 80.8 fl (ref 78.0–100.0)
Platelets: 235 10*3/uL (ref 150.0–400.0)
RBC: 4.77 Mil/uL (ref 3.87–5.11)
RDW: 13.4 % (ref 11.5–15.5)
WBC: 7.9 10*3/uL (ref 4.0–10.5)

## 2022-01-07 LAB — LDL CHOLESTEROL, DIRECT: Direct LDL: 129 mg/dL

## 2022-01-07 NOTE — Assessment & Plan Note (Signed)
Controlled despite no metformin for 3 months.  A1c today 5.7. ? ?Today we discussed options, she would like to remain off of metformin as she has been working on her diet.  This is very reasonable. ? ?Repeat A1c in 3 months. ?

## 2022-01-07 NOTE — Patient Instructions (Addendum)
Stop by the lab prior to leaving today. I will notify you of your results once received.  ? ?Consider resuming your CPAP machine as sleep apnea can cause you to feel tired. ? ?Depression can cause fatigue/feeling tired. We can try treatment with medication if needed.  ? ?Set up a lab appointment for 3 months to recheck your diabetes.  ? ?It was a pleasure to see you today! ? ? ?

## 2022-01-07 NOTE — Assessment & Plan Note (Signed)
Unable to tolerate CPAP machine historically, has never used more than once. ? ?Discussed today that untreated sleep apnea can contribute to fatigue. ? ? ?

## 2022-01-07 NOTE — Assessment & Plan Note (Signed)
Differentials include untreated sleep apnea, depression, metabolic cause. ? ?Checking labs today including TSH, vitamin D, vitamin B12, CMP, CBC. ? ?Discussed potential need for CPAP machine. ?I offered to treat her mild depression, she currently declines at this time. ? ?Await results. ?

## 2022-01-07 NOTE — Progress Notes (Signed)
? ?Subjective:  ? ? Patient ID: Gloria Wade, female    DOB: Jul 29, 1976, 45 y.o.   MRN: [id number] ? ?HPI ? ?Anesha Mankin is a very pleasant 46 y.o. female with a history of type 2 diabetes, chronic knee pain, hyperlipidemia, OSA who presents today for follow-up of chronic conditions and medication refills. ? ?1) Type 2 Diabetes: ? ?Current medications include: metformin XR 500 mg daily. She ran out of metformin three months ago. ? ? ?Last A1C: 5.7 in February 2022, 5.7 today ?Last Eye Exam: Due ?Last Foot Exam: Due ?Pneumonia Vaccination: Has not completed ?Urine Microalbumin: Due ?Statin: Rosuvastatin  ? ?Dietary changes since last visit: Less starchy food. Increasing vegetables.  ? ? ?Exercise: Active at work.  ? ?2) Hyperlipidemia: Currently managed on rosuvastatin 5 mg daily.  Due for repeat lipid panel today.  She ran out of her rosuvastatin 3 months ago. ? ?3) Fatigue: Acute for the last 2 months. History of OSA, doesn't not use CPAP. She doesn't sleep well at times, sometimes sleeps 4-5 hours, sometimes 7-8 hours. She does snore at night but this has improved per her husband.  ? ?She does notice depression with symptoms of fatigue, doesn't want to do much.  Overall she feels that her symptoms are mild and that she is able to control them.  ? ?She underwent total hysterectomy with bilateral salpingectomy in late 2020 due to moderate vaginal prolapse and rectocele.  She is not on hormone treatment. ? ?Review of Systems  ?Constitutional:  Positive for fatigue.  ?Respiratory:  Negative for shortness of breath.   ?Cardiovascular:  Negative for chest pain.  ?Neurological:  Negative for dizziness.  ?Psychiatric/Behavioral:    ?     See HPI  ? ?   ? ? ?No past medical history on file. ? ?Social History  ? ?Socioeconomic History  ? Marital status: Married  ?  Spouse name: Not on file  ? Number of children: Not on file  ? Years of education: Not on file  ? Highest education level: Not on file  ?Occupational History  ?  Not on file  ?Tobacco Use  ? Smoking status: Never  ? Smokeless tobacco: Never  ?Substance and Sexual Activity  ? Alcohol use: No  ? Drug use: Not on file  ? Sexual activity: Not on file  ?Other Topics Concern  ? Not on file  ?Social History Narrative  ? Not on file  ? ?Social Determinants of Health  ? ?Financial Resource Strain: Not on file  ?Food Insecurity: Not on file  ?Transportation Needs: Not on file  ?Physical Activity: Not on file  ?Stress: Not on file  ?Social Connections: Not on file  ?Intimate Partner Violence: Not on file  ? ? ?No past surgical history on file. ? ?No family history on file. ? ?No Known Allergies ? ?Current Outpatient Medications on File Prior to Visit  ?Medication Sig Dispense Refill  ? acetaminophen (TYLENOL) 325 MG tablet Take by mouth.    ? Aspirin-Salicylamide-Caffeine (ARTHRITIS STRENGTH BC POWDER PO) Take by mouth.    ? ibuprofen (ADVIL) 600 MG tablet Take by mouth.    ? metFORMIN (GLUCOPHAGE-XR) 500 MG 24 hr tablet Take 1 tablet (500 mg total) by mouth daily with breakfast. For diabetes. Office visit required for further refills. (Patient not taking: Reported on 01/07/2022) 30 tablet 0  ? rosuvastatin (CRESTOR) 5 MG tablet Take 1 tablet (5 mg total) by mouth daily. For cholesterol. Office visit required for further refills. (Patient not  taking: Reported on 01/07/2022) 30 tablet 0  ? ?No current facility-administered medications on file prior to visit.  ? ? ?BP 128/78   Pulse 88   Temp 98.6 ?F (37 ?C) (Temporal)   Ht 4\' 9"  (1.448 m)   Wt 177 lb (80.3 kg)   LMP 11/18/2016   SpO2 98%   BMI 38.30 kg/m?  ?Objective:  ? Physical Exam ?Cardiovascular:  ?   Rate and Rhythm: Normal rate and regular rhythm.  ?Pulmonary:  ?   Effort: Pulmonary effort is normal.  ?   Breath sounds: Normal breath sounds.  ?Musculoskeletal:  ?   Cervical back: Neck supple.  ?Skin: ?   General: Skin is warm and dry.  ?Psychiatric:     ?   Mood and Affect: Mood normal.  ? ? ? ? ? ?   ?Assessment & Plan:   ? ? ? ? ?This visit occurred during the SARS-CoV-2 public health emergency.  Safety protocols were in place, including screening questions prior to the visit, additional usage of staff PPE, and extensive cleaning of exam room while observing appropriate contact time as indicated for disinfecting solutions.  ?

## 2022-01-07 NOTE — Assessment & Plan Note (Signed)
No use of Crestor for 3 months. ?Repeat lipid panel pending. ? ?We will determine continued use based on lipid panel results. ?

## 2022-01-08 ENCOUNTER — Other Ambulatory Visit: Payer: Self-pay | Admitting: Primary Care

## 2022-01-08 DIAGNOSIS — E559 Vitamin D deficiency, unspecified: Secondary | ICD-10-CM

## 2022-01-08 MED ORDER — VITAMIN D (ERGOCALCIFEROL) 1.25 MG (50000 UNIT) PO CAPS
ORAL_CAPSULE | ORAL | 0 refills | Status: DC
Start: 2022-01-08 — End: 2022-10-28

## 2022-04-06 ENCOUNTER — Other Ambulatory Visit: Payer: Self-pay | Admitting: Primary Care

## 2022-04-06 DIAGNOSIS — E559 Vitamin D deficiency, unspecified: Secondary | ICD-10-CM

## 2022-04-08 ENCOUNTER — Other Ambulatory Visit (INDEPENDENT_AMBULATORY_CARE_PROVIDER_SITE_OTHER): Payer: 59

## 2022-04-08 DIAGNOSIS — E559 Vitamin D deficiency, unspecified: Secondary | ICD-10-CM

## 2022-04-08 DIAGNOSIS — E785 Hyperlipidemia, unspecified: Secondary | ICD-10-CM

## 2022-04-08 DIAGNOSIS — E119 Type 2 diabetes mellitus without complications: Secondary | ICD-10-CM

## 2022-04-08 LAB — VITAMIN D 25 HYDROXY (VIT D DEFICIENCY, FRACTURES): VITD: 44.68 ng/mL (ref 30.00–100.00)

## 2022-04-08 LAB — LIPID PANEL
Cholesterol: 237 mg/dL — ABNORMAL HIGH (ref 0–200)
HDL: 49.7 mg/dL (ref >39.00)
NonHDL: 186.8
Total CHOL/HDL Ratio: 5
Triglycerides: 244 mg/dL — ABNORMAL HIGH (ref 0.0–149.0)
VLDL: 48.8 mg/dL — ABNORMAL HIGH (ref 0.0–40.0)

## 2022-04-08 LAB — HEMOGLOBIN A1C: Hgb A1c MFr Bld: 6.1 % (ref 4.6–6.5)

## 2022-04-08 LAB — LDL CHOLESTEROL, DIRECT: Direct LDL: 160 mg/dL

## 2022-04-10 ENCOUNTER — Other Ambulatory Visit: Payer: Self-pay | Admitting: Primary Care

## 2022-04-10 ENCOUNTER — Telehealth: Payer: Self-pay

## 2022-04-10 DIAGNOSIS — E785 Hyperlipidemia, unspecified: Secondary | ICD-10-CM

## 2022-04-10 MED ORDER — ROSUVASTATIN CALCIUM 5 MG PO TABS: 5.0000 mg | ORAL_TABLET | Freq: Every day | ORAL | 1 refills | Status: DC

## 2022-04-10 NOTE — Telephone Encounter (Signed)
See lab notes for documentation  

## 2022-04-10 NOTE — Telephone Encounter (Signed)
Patient returning a call about lab results.  

## 2022-07-08 ENCOUNTER — Ambulatory Visit: Payer: 59 | Admitting: Primary Care

## 2022-07-22 ENCOUNTER — Ambulatory Visit: Payer: Self-pay | Admitting: Primary Care

## 2022-10-14 ENCOUNTER — Telehealth: Payer: Self-pay | Admitting: Primary Care

## 2022-10-14 NOTE — Telephone Encounter (Signed)
Yes, happy to see her son as long as he is 48 years of age or older. She is also due for follow up :) can we get her scheduled too?

## 2022-10-14 NOTE — Telephone Encounter (Signed)
Patient was wanting to know if you would take on her son. She stated all her family see you and she would like to get him scheduled with her. Please advise. Thank you!

## 2022-10-15 NOTE — Telephone Encounter (Signed)
Patient has a physical scheduled on 12/26 and her son has been scheduled too. Thank you!

## 2022-10-28 ENCOUNTER — Ambulatory Visit (INDEPENDENT_AMBULATORY_CARE_PROVIDER_SITE_OTHER)
Admission: RE | Admit: 2022-10-28 | Discharge: 2022-10-28 | Disposition: A | Discharge status: Home / Self Care | Payer: Self-pay | Source: Ambulatory Visit | Attending: Primary Care | Admitting: Primary Care

## 2022-10-28 ENCOUNTER — Other Ambulatory Visit: Payer: Self-pay

## 2022-10-28 ENCOUNTER — Encounter: Payer: Self-pay | Admitting: Primary Care

## 2022-10-28 ENCOUNTER — Ambulatory Visit (INDEPENDENT_AMBULATORY_CARE_PROVIDER_SITE_OTHER): Payer: Self-pay | Admitting: Primary Care

## 2022-10-28 VITALS — BP 120/78 | HR 91 | Temp 97.8°F | Ht <= 58 in | Wt 184.0 lb

## 2022-10-28 DIAGNOSIS — M25561 Pain in right knee: Secondary | ICD-10-CM

## 2022-10-28 DIAGNOSIS — E1165 Type 2 diabetes mellitus with hyperglycemia: Secondary | ICD-10-CM

## 2022-10-28 DIAGNOSIS — M546 Pain in thoracic spine: Secondary | ICD-10-CM

## 2022-10-28 DIAGNOSIS — E785 Hyperlipidemia, unspecified: Secondary | ICD-10-CM

## 2022-10-28 DIAGNOSIS — G4733 Obstructive sleep apnea (adult) (pediatric): Secondary | ICD-10-CM

## 2022-10-28 DIAGNOSIS — R5383 Other fatigue: Secondary | ICD-10-CM

## 2022-10-28 DIAGNOSIS — Z0001 Encounter for general adult medical examination with abnormal findings: Secondary | ICD-10-CM

## 2022-10-28 DIAGNOSIS — G8929 Other chronic pain: Secondary | ICD-10-CM

## 2022-10-28 DIAGNOSIS — Z1231 Encounter for screening mammogram for malignant neoplasm of breast: Secondary | ICD-10-CM

## 2022-10-28 DIAGNOSIS — M545 Low back pain, unspecified: Secondary | ICD-10-CM

## 2022-10-28 DIAGNOSIS — M25562 Pain in left knee: Secondary | ICD-10-CM

## 2022-10-28 DIAGNOSIS — E559 Vitamin D deficiency, unspecified: Secondary | ICD-10-CM

## 2022-10-28 LAB — COMPREHENSIVE METABOLIC PANEL
ALT: 18 U/L (ref 0–35)
AST: 19 U/L (ref 0–37)
Albumin: 4.1 g/dL (ref 3.5–5.2)
Alkaline Phosphatase: 48 U/L (ref 39–117)
BUN: 17 mg/dL (ref 6–23)
CO2: 30 mEq/L (ref 19–32)
Calcium: 9.5 mg/dL (ref 8.4–10.5)
Chloride: 103 mEq/L (ref 96–112)
Creatinine, Ser: 0.79 mg/dL (ref 0.40–1.20)
GFR: 88.42 mL/min (ref >60.00)
Glucose, Bld: 112 mg/dL — ABNORMAL HIGH (ref 70–99)
Potassium: 4.2 mEq/L (ref 3.5–5.1)
Sodium: 142 mEq/L (ref 135–145)
Total Bilirubin: 0.2 mg/dL (ref 0.2–1.2)
Total Protein: 7.2 g/dL (ref 6.0–8.3)

## 2022-10-28 LAB — VITAMIN B12: Vitamin B-12: 622 pg/mL (ref 211–911)

## 2022-10-28 LAB — CBC
HCT: 41.3 % (ref 36.0–46.0)
Hemoglobin: 13.7 g/dL (ref 12.0–15.0)
MCHC: 33.1 g/dL (ref 30.0–36.0)
MCV: 80 fl (ref 78.0–100.0)
Platelets: 316 10*3/uL (ref 150.0–400.0)
RBC: 5.16 Mil/uL — ABNORMAL HIGH (ref 3.87–5.11)
RDW: 13.6 % (ref 11.5–15.5)
WBC: 7.9 10*3/uL (ref 4.0–10.5)

## 2022-10-28 LAB — LIPID PANEL
Cholesterol: 261 mg/dL — ABNORMAL HIGH (ref 0–200)
HDL: 51.3 mg/dL (ref >39.00)
Total CHOL/HDL Ratio: 5
Triglycerides: 416 mg/dL — ABNORMAL HIGH (ref 0.0–149.0)

## 2022-10-28 LAB — VITAMIN D 25 HYDROXY (VIT D DEFICIENCY, FRACTURES): VITD: 16.69 ng/mL — ABNORMAL LOW (ref 30.00–100.00)

## 2022-10-28 LAB — LDL CHOLESTEROL, DIRECT: Direct LDL: 163 mg/dL

## 2022-10-28 LAB — IBC + FERRITIN
Ferritin: 54.9 ng/mL (ref 10.0–291.0)
Iron: 59 ug/dL (ref 42–145)
Saturation Ratios: 17.3 % — ABNORMAL LOW (ref 20.0–50.0)
TIBC: 340.2 ug/dL (ref 250.0–450.0)
Transferrin: 243 mg/dL (ref 212.0–360.0)

## 2022-10-28 LAB — TSH: TSH: 2.76 u[IU]/mL (ref 0.35–5.50)

## 2022-10-28 MED ORDER — MELOXICAM 7.5 MG PO TABS: 7.5000 mg | ORAL_TABLET | Freq: Every evening | ORAL | 0 refills | Status: DC | PRN

## 2022-10-28 NOTE — Assessment & Plan Note (Signed)
Out of metformin for months. Repeat A1C pending today.  Discussed to schedule eye exam.  Foot exam and urine microalbumin due, will complete next visit.  Follow up in 3-6 months based on A1C result.  Remain off metformin for now.

## 2022-10-28 NOTE — Progress Notes (Signed)
Subjective:    Patient ID: Gloria Wade, female    DOB: 1975/12/11, 46 y.o.   MRN: [id number]  HPI  Gloria Wade is a very pleasant 46 y.o. female who presents today for complete physical and follow up of chronic conditions.  She would also like to discuss chronic back pain. Chronic to the thoracic back. 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 Her pain starts to the upper thoracic back and radiates down to lumbar spine. Evaluated by rheumatology in the past, was told that her chronic joint pain was from arthritis.   She continues to experience chronic bilateral knee pain. Evaluated by rheumatology, was told that knee injections were not an option given her younger age. She has been taking BC Powder and Tylenol for her knee pain without much improvement. Her pain is worse at night. She does stand and work all day on concrete.   She continues to feel chronically fatigued. Wakes during the night often. She underwent sleep study in 2021 and was advised to return for lab sleep study for which she has yet to do. She could not tolerate the CPAP machine that was recommended.   Immunizations: -Tetanus: 2021 -Influenza: Declines   Diet: Fair diet.  Exercise: No regular exercise.  Eye exam: No recent exam  Dental exam: Completes semi-annually   Pap Smear: Complete hysterectomy Mammogram: Completed 20-3 years ago  Colonoscopy: Never completed, Declines today  BP Readings from Last 3 Encounters:  10/28/22 120/78  01/07/22 128/78  12/17/20 126/78          Review of Systems  Constitutional:  Positive for fatigue. Negative for unexpected weight change.  HENT:  Negative for rhinorrhea.   Respiratory:  Negative for cough and shortness of breath.   Cardiovascular:  Negative for chest pain.  Gastrointestinal:  Negative for constipation and diarrhea.  Genitourinary:  Negative for difficulty  urinating.  Musculoskeletal:  Positive for arthralgias and back pain.  Skin:  Negative for rash.  Allergic/Immunologic: Negative for environmental allergies.  Neurological:  Negative for dizziness and headaches.  Psychiatric/Behavioral:  The patient is not nervous/anxious.          Past Medical History:  Diagnosis Date   Hyperlipidemia    OSA (obstructive sleep apnea)    Type 2 diabetes mellitus (HCC)     Social History   Socioeconomic History   Marital status: Married    Spouse name: Not on file   Number of children: Not on file   Years of education: Not on file   Highest education level: Not on file  Occupational History   Not on file  Tobacco Use   Smoking status: Never   Smokeless tobacco: Never  Substance and Sexual Activity   Alcohol use: No   Drug use: Not on file   Sexual activity: Not on file  Other Topics Concern   Not on file  Social History Narrative   Not on file   Social Determinants of Health   Financial Resource Strain: Not on file  Food Insecurity: Not on file  Transportation Needs: Not on file  Physical Activity: Not on file  Stress: Not on file  Social Connections: Not on file  Intimate Partner Violence: Not on file    Past Surgical History:  Procedure Laterality Date   BILATERAL SALPINGECTOMY Bilateral    TOTAL VAGINAL HYSTERECTOMY      No family  history on file.  No Known Allergies  Current Outpatient Medications on File Prior to Visit  Medication Sig Dispense Refill   acetaminophen (TYLENOL) 325 MG tablet Take by mouth.     Aspirin-Salicylamide-Caffeine (ARTHRITIS STRENGTH BC POWDER PO) Take by mouth.     rosuvastatin (CRESTOR) 5 MG tablet Take 1 tablet (5 mg total) by mouth daily. For cholesterol. 90 tablet 1   No current facility-administered medications on file prior to visit.    BP 120/78 (BP Location: Left Arm, Patient Position: Sitting, Cuff Size: Normal)   Pulse 91   Temp 97.8 F (36.6 C) (Temporal)   Ht 4\' 9"   (1.448 m)   Wt 184 lb (83.5 kg)   LMP 11/18/2016   SpO2 94%   BMI 39.82 kg/m  Objective:   Physical Exam HENT:     Right Ear: Tympanic membrane and ear canal normal.     Left Ear: Tympanic membrane and ear canal normal.     Nose: Nose normal.  Eyes:     Conjunctiva/sclera: Conjunctivae normal.     Pupils: Pupils are equal, round, and reactive to light.  Neck:     Thyroid: No thyromegaly.  Cardiovascular:     Rate and Rhythm: Normal rate and regular rhythm.     Heart sounds: No murmur heard. Pulmonary:     Effort: Pulmonary effort is normal.     Breath sounds: Normal breath sounds. No rales.  Abdominal:     General: Bowel sounds are normal.     Palpations: Abdomen is soft.     Tenderness: There is no abdominal tenderness.  Musculoskeletal:        General: Normal range of motion.     Cervical back: Neck supple.  Lymphadenopathy:     Cervical: No cervical adenopathy.  Skin:    General: Skin is warm and dry.     Findings: No rash.  Neurological:     Mental Status: She is alert and oriented to person, place, and time.     Cranial Nerves: No cranial nerve deficit.     Deep Tendon Reflexes: Reflexes are normal and symmetric.  Psychiatric:        Mood and Affect: Mood normal.           Assessment & Plan:   Problem List Items Addressed This Visit       Respiratory   OSA (obstructive sleep apnea)    Active symptoms.  No use of CPAP in years. Unable to tolerate.  Offered to refer her back to pulmonology for other CPAP options, she declines today but will think about this.        Endocrine   Type 2 diabetes mellitus with hyperglycemia (HCC)    Out of metformin for months. Repeat A1C pending today.  Discussed to schedule eye exam.  Foot exam and urine microalbumin due, will complete next visit.  Follow up in 3-6 months based on A1C result.  Remain off metformin for now.      Relevant Orders   Hemoglobin A1c     Other   Chronic pain of both knees     Trial of Meloxicam 7.5 mg HS provided today. Encouraged weight loss.  She will update.      Relevant Medications   meloxicam (MOBIC) 7.5 MG tablet   Encounter for annual general medical examination with abnormal findings in adult - Primary    Declines flu shot. Tetanus UTD. Mammogram overdue, orders placed. She continues to decline colonoscopy , would  like to wait until age 2.  Discussed the importance of a healthy diet and regular exercise in order for weight loss, and to reduce the risk of further co-morbidity.  Exam stable. Labs pending.  Follow up in 1 year for repeat physical.       Hyperlipidemia    Continue rosuvastatin 5 mg daily. Repeat lipid panel pending.      Relevant Orders   Lipid panel   Comprehensive metabolic panel   VITAMIN D 25 Hydroxy (Vit-D Deficiency, Fractures)   Fatigue    Suspect this is secondary to untreated sleep apnea.  Discussed this with patient today.  She declines further evaluation for sleep apnea treatment options, she will notify if she changes her mind.  Labs pending today including iron studies, CMP, A1C, TSH, vitamin levels.      Relevant Orders   TSH   CBC   IBC + Ferritin   Vitamin B12   Chronic midline thoracic back pain    Likely secondary to her occupation. Checking plain films of the lumbar and thoracic spine today. No alarm signs.  Consider PT. Start meloxicam 7.5 mg HS.      Relevant Medications   meloxicam (MOBIC) 7.5 MG tablet   Other Relevant Orders   DG Thoracic Spine 2 View   Chronic bilateral low back pain without sciatica    Likely secondary to her occupation. Checking plain films of the lumbar and thoracic spine today. No alarm signs.  Consider PT. Start meloxicam 7.5 mg HS.      Relevant Medications   meloxicam (MOBIC) 7.5 MG tablet   Other Relevant Orders   DG Lumbar Spine 2-3 Views   Other Visit Diagnoses     Vitamin D deficiency       Encounter for screening mammogram for malignant  neoplasm of breast       Relevant Orders   MM 3D SCREEN BREAST BILATERAL          Doreene Nest, NP

## 2022-10-28 NOTE — Assessment & Plan Note (Signed)
Suspect this is secondary to untreated sleep apnea.  Discussed this with patient today.  She declines further evaluation for sleep apnea treatment options, she will notify if she changes her mind.  Labs pending today including iron studies, CMP, A1C, TSH, vitamin levels.

## 2022-10-28 NOTE — Assessment & Plan Note (Signed)
Declines flu shot. Tetanus UTD. Mammogram overdue, orders placed. She continues to decline colonoscopy , would like to wait until age 45.  Discussed the importance of a healthy diet and regular exercise in order for weight loss, and to reduce the risk of further co-morbidity.  Exam stable. Labs pending.  Follow up in 1 year for repeat physical.

## 2022-10-28 NOTE — Assessment & Plan Note (Signed)
Trial of Meloxicam 7.5 mg HS provided today. Encouraged weight loss.  She will update.

## 2022-10-28 NOTE — Assessment & Plan Note (Addendum)
Active symptoms.  No use of CPAP in years. Unable to tolerate.  Offered to refer her back to pulmonology for other CPAP options, she declines today but will think about this.

## 2022-10-28 NOTE — Assessment & Plan Note (Signed)
Likely secondary to her occupation. Checking plain films of the lumbar and thoracic spine today. No alarm signs.  Consider PT. Start meloxicam 7.5 mg HS.

## 2022-10-28 NOTE — Assessment & Plan Note (Signed)
Continue rosuvastatin 5 mg daily. Repeat lipid panel pending 

## 2022-10-28 NOTE — Assessment & Plan Note (Signed)
Likely secondary to her occupation. Checking plain films of the lumbar and thoracic spine today. No alarm signs.  Consider PT. Start meloxicam 7.5 mg HS. 

## 2022-10-28 NOTE — Patient Instructions (Addendum)
Stop by the lab and xray prior to leaving today. I will notify you of your results once received.   Start Meloxicam medication for your knee pain. Take 1 tablet by mouth at bedtime.  Please consider meeting with Sarah for your sleep apnea. This is causing your tiredness.   [facility].   It was a pleasure to see you today!  Preventive Care 48-48 Years Old, Female Preventive care refers to lifestyle choices and visits with your health care provider that can promote health and wellness. Preventive care visits are also called wellness exams. What can I expect for my preventive care visit? Counseling Your health care provider may ask you questions about your: Medical history, including: Past medical problems. Family medical history. Pregnancy history. Current health, including: Menstrual cycle. Method of birth control. Emotional well-being. Home life and relationship well-being. Sexual activity and sexual health. Lifestyle, including: Alcohol, nicotine or tobacco, and drug use. Access to firearms. Diet, exercise, and sleep habits. Work and work Statistician. Sunscreen use. Safety issues such as seatbelt and bike helmet use. Physical exam Your health care provider will check your: Height and weight. These may be used to calculate your BMI (body mass index). BMI is a measurement that tells if you are at a healthy weight. Waist circumference. This measures the distance around your waistline. This measurement also tells if you are at a healthy weight and may help predict your risk of certain diseases, such as type 2 diabetes and high blood pressure. Heart rate and blood pressure. Body temperature. Skin for abnormal spots. What immunizations do I need?  Vaccines are usually given at various ages, according to a schedule. Your health care provider will recommend vaccines for you based on your age, medical history, and lifestyle or other factors,  such as travel or where you work. What tests do I need? Screening Your health care provider may recommend screening tests for certain conditions. This may include: Lipid and cholesterol levels. Diabetes screening. This is done by checking your blood sugar (glucose) after you have not eaten for a while (fasting). Pelvic exam and Pap test. Hepatitis B test. Hepatitis C test. HIV (human immunodeficiency virus) test. STI (sexually transmitted infection) testing, if you are at risk. Lung cancer screening. Colorectal cancer screening. Mammogram. Talk with your health care provider about when you should start having regular mammograms. This may depend on whether you have a family history of breast cancer. BRCA-related cancer screening. This may be done if you have a family history of breast, ovarian, tubal, or peritoneal cancers. Bone density scan. This is done to screen for osteoporosis. Talk with your health care provider about your test results, treatment options, and if necessary, the need for more tests. Follow these instructions at home: Eating and drinking  Eat a diet that includes fresh fruits and vegetables, whole grains, lean protein, and low-fat dairy products. Take vitamin and mineral supplements as recommended by your health care provider. Do not drink alcohol if: Your health care provider tells you not to drink. You are pregnant, may be pregnant, or are planning to become pregnant. If you drink alcohol: Limit how much you have to 0-1 drink a day. Know how much alcohol is in your drink. In the [place] one drink equals one 12 oz bottle of beer (355 mL), one 5 oz glass of wine (148 mL), or one 1 oz glass of hard liquor (44 mL). Lifestyle Brush your teeth every morning and night with fluoride  toothpaste. Floss one time each day. Exercise for at least 30 minutes 5 or more days each week. Do not use any products that contain nicotine or tobacco. These products include cigarettes,  chewing tobacco, and vaping devices, such as e-cigarettes. If you need help quitting, ask your health care provider. Do not use drugs. If you are sexually active, practice safe sex. Use a condom or other form of protection to prevent STIs. If you do not wish to become pregnant, use a form of birth control. If you plan to become pregnant, see your health care provider for a prepregnancy visit. Take aspirin only as told by your health care provider. Make sure that you understand how much to take and what form to take. Work with your health care provider to find out whether it is safe and beneficial for you to take aspirin daily. Find healthy ways to manage stress, such as: Meditation, yoga, or listening to music. Journaling. Talking to a trusted person. Spending time with friends and family. Minimize exposure to UV radiation to reduce your risk of skin cancer. Safety Always wear your seat belt while driving or riding in a vehicle. Do not drive: If you have been drinking alcohol. Do not ride with someone who has been drinking. When you are tired or distracted. While texting. If you have been using any mind-altering substances or drugs. Wear a helmet and other protective equipment during sports activities. If you have firearms in your house, make sure you follow all gun safety procedures. Seek help if you have been physically or sexually abused. What's next? Visit your health care provider once a year for an annual wellness visit. Ask your health care provider how often you should have your eyes and teeth checked. Stay up to date on all vaccines. This information is not intended to replace advice given to you by your health care provider. Make sure you discuss any questions you have with your health care provider. Document Revised: 04/17/2021 Document Reviewed: 04/17/2021 Elsevier Patient Education  [employer].

## 2022-10-29 ENCOUNTER — Other Ambulatory Visit: Payer: Self-pay | Admitting: Primary Care

## 2022-10-29 DIAGNOSIS — E785 Hyperlipidemia, unspecified: Secondary | ICD-10-CM

## 2022-10-29 DIAGNOSIS — E559 Vitamin D deficiency, unspecified: Secondary | ICD-10-CM

## 2022-10-29 LAB — HEMOGLOBIN A1C
Hgb A1c MFr Bld: 6 % of total Hgb — ABNORMAL HIGH (ref <5.7)
Mean Plasma Glucose: 126 mg/dL
eAG (mmol/L): 7 mmol/L

## 2022-10-29 MED ORDER — VITAMIN D (ERGOCALCIFEROL) 1.25 MG (50000 UNIT) PO CAPS: ORAL_CAPSULE | ORAL | 0 refills | Status: DC

## 2022-12-01 ENCOUNTER — Other Ambulatory Visit: Payer: Self-pay | Admitting: Primary Care

## 2022-12-01 DIAGNOSIS — G8929 Other chronic pain: Secondary | ICD-10-CM

## 2022-12-01 NOTE — Telephone Encounter (Signed)
Received refill request for meloxicam for joint pain. Is this helping?  Does she want a refill? How often is she taking?

## 2022-12-02 NOTE — Telephone Encounter (Signed)
Called and spoke to patient, she states it has helped a lot for her joint pain. She would like a refill, she is not taking it every day. More like every couple of days when she is bothered by the pain.

## 2022-12-02 NOTE — Telephone Encounter (Signed)
Refills sent to pharmacy. 

## 2023-01-10 ENCOUNTER — Other Ambulatory Visit: Payer: Self-pay | Admitting: Primary Care

## 2023-01-10 DIAGNOSIS — E785 Hyperlipidemia, unspecified: Secondary | ICD-10-CM

## 2023-02-02 ENCOUNTER — Other Ambulatory Visit: Payer: 59

## 2023-02-02 DIAGNOSIS — E785 Hyperlipidemia, unspecified: Secondary | ICD-10-CM

## 2023-02-02 DIAGNOSIS — E559 Vitamin D deficiency, unspecified: Secondary | ICD-10-CM

## 2023-02-02 LAB — LIPID PANEL
Cholesterol: 173 mg/dL (ref 0–200)
HDL: 46.8 mg/dL (ref >39.00)
LDL Cholesterol: 91 mg/dL (ref 0–99)
NonHDL: 126.17
Total CHOL/HDL Ratio: 4
Triglycerides: 176 mg/dL — ABNORMAL HIGH (ref 0.0–149.0)
VLDL: 35.2 mg/dL (ref 0.0–40.0)

## 2023-02-02 LAB — VITAMIN D 25 HYDROXY (VIT D DEFICIENCY, FRACTURES): VITD: 31.72 ng/mL (ref 30.00–100.00)

## 2023-02-03 ENCOUNTER — Other Ambulatory Visit: Payer: Self-pay | Admitting: Primary Care

## 2023-02-03 DIAGNOSIS — E559 Vitamin D deficiency, unspecified: Secondary | ICD-10-CM

## 2023-02-03 MED ORDER — VITAMIN D (ERGOCALCIFEROL) 1.25 MG (50000 UNIT) PO CAPS: ORAL_CAPSULE | ORAL | 0 refills | Status: DC

## 2023-02-26 ENCOUNTER — Other Ambulatory Visit: Payer: Self-pay | Admitting: Primary Care

## 2023-02-26 DIAGNOSIS — G8929 Other chronic pain: Secondary | ICD-10-CM

## 2023-04-23 ENCOUNTER — Other Ambulatory Visit: Payer: Self-pay | Admitting: Primary Care

## 2023-04-23 ENCOUNTER — Encounter: Payer: Self-pay | Admitting: Primary Care

## 2023-04-23 ENCOUNTER — Ambulatory Visit (INDEPENDENT_AMBULATORY_CARE_PROVIDER_SITE_OTHER): Payer: 59 | Admitting: Primary Care

## 2023-04-23 VITALS — BP 136/78 | HR 87 | Temp 97.4°F | Ht <= 58 in | Wt 184.0 lb

## 2023-04-23 DIAGNOSIS — E1165 Type 2 diabetes mellitus with hyperglycemia: Secondary | ICD-10-CM

## 2023-04-23 DIAGNOSIS — Z7985 Long-term (current) use of injectable non-insulin antidiabetic drugs: Secondary | ICD-10-CM

## 2023-04-23 DIAGNOSIS — Z7984 Long term (current) use of oral hypoglycemic drugs: Secondary | ICD-10-CM

## 2023-04-23 LAB — POCT GLYCOSYLATED HEMOGLOBIN (HGB A1C): Hemoglobin A1C: 11.5 % — AB (ref 4.0–5.6)

## 2023-04-23 MED ORDER — LANCETS MISC. MISC
1.0000 | Freq: Three times a day (TID) | 0 refills | Status: AC
Start: 2023-04-23 — End: ?

## 2023-04-23 MED ORDER — BLOOD GLUCOSE TEST VI STRP: 1.0000 | ORAL_STRIP | Freq: Three times a day (TID) | 0 refills | Status: AC

## 2023-04-23 MED ORDER — BLOOD GLUCOSE MONITORING SUPPL DEVI: 1.0000 | Freq: Three times a day (TID) | 0 refills | Status: AC

## 2023-04-23 MED ORDER — OZEMPIC (0.25 OR 0.5 MG/DOSE) 2 MG/3ML ~~LOC~~ SOPN: PEN_INJECTOR | SUBCUTANEOUS | 0 refills | Status: DC

## 2023-04-23 MED ORDER — LANCET DEVICE MISC: 1.0000 | Freq: Three times a day (TID) | 0 refills | Status: AC

## 2023-04-23 NOTE — Patient Instructions (Addendum)
Start checking your blood sugar levels.  Appropriate times to check your blood sugar levels are:  -Before any meal (breakfast, lunch, dinner) -Two hours after any meal (breakfast, lunch, dinner) -Bedtime  Record your readings and notify me if you continue to consistently run at or above 150 after 1 month of being on the Ozempic medication.  Start semaglutide (Ozempic) for diabetes/weight loss. Start by injecting 0.25 mg into the skin once weekly for 4 weeks, then increase to 0.5 mg once weekly thereafter.   Please call me if the pharmacy cannot give you 3 months of this medication.  Please call me if the medication is too expensive.  Limit consumption of starchy foods such as rice, potatoes, noodles, bread.  Avoid fried food and sweet food.  Avoid sweet drinks.  Please schedule a follow up visit for 3 months.  It was a pleasure to see you today!   ???????????????????????????????????????????????  ???????????????????????????????????????????????????????????  - ???????????????? (????????????? ?????????????? ?????????????) - ????????????????? (????????????? ????????? ?????????????) - ??????????  ????????????????????? ?????????????????? ??????????????????????????????????????? ?????? 150 ????????? 1 ???????????????? Ozempic ?  ????????? semaglutide (Ozempic) ?????????????????????? / ??????????????? ??????????????????? 0.25 mg ???????????????????????????????????????????? 4 ????????????????????????? 0.5 mg ????????????????????????????????   ?????????????????? ??????????????????????????????????????????????????? 3 ???  ???????????????????????????????????????  ?????????????????????????????????? ?????????? ????? ?? ???????  ?????????????? ??????????????  ???????????????????  ????????????????????????????????? ? ???  ????????????????????????????! chabphtaem pinity kamrit cheate skar knong chheam robsa anak .   pelvelea samosrab daembi pinity kamrit cheate skar knong chheam robsa anak ku  :  -  moun pel ahar namuoy ( ahar pelopruk  ahar thngaitrang  ahar pelolngeach) -  pir maong kraoy ahar ( ahar pelopruk thngaitrang  ahar pelolngeach) -  maong chaulkeng   kttra kar an robsa anak  haey choundamnoeng mk khnhom  brasenbae anak bant damnaerkar cheabantobanteab now  ryy leusa pi 150 banteabpi 1  khe nei kar brae thnam Ozempic  .  chabphtaem semaglutide (Ozempic)  samreab chomngu tuknomophaem /  kar samrok tomngon .  chabphtaem daoy kar chak 0.25 mg  tow knong sbek mtong knong muoy sa bta  samreab rypel 4  sa bta  banteabmk bangkeun dl 0.5 mg  mtong knong muoy sa bta  banteabpi noh .    saum toursapt mk khnhom  brasenbae aosathasthan min ach phtal aoy anak nouv thnam nih rypel 3  khe .   saum toursapt mk khnhom brasenbae thnam thlai pek .   kamnt kar ttuoltean ahar del mean cheatemsaow  dauchchea angkor damlaung mi  nombng .   chiesaveang ahar chien  ning ahar phaem .   chiesaveang phesachch phaem .   saum kamnt pel samreab kar tamdan rypel 3  khe .   rikreay del ban chuob anak now thngainih!

## 2023-04-23 NOTE — Progress Notes (Signed)
Subjective:    Patient ID: Gloria Wade, female    DOB: 01/25/1976, 47 y.o.   MRN: [id number]  HPI  Gloria Wade is a very pleasant 47 y.o. female with a history of type 2 diabetes, hyperlipidemia, chronic knee pain, OSA, chronic back pain who presents today for follow-up of diabetes.   Current medications include: None.  She is checking her blood glucose 0 times daily.  Last A1C: 6.0 in December 2023, 11.5 today Last Eye Exam: Due Last Foot Exam: Due Pneumonia Vaccination: Never completed Urine Microalbumin: Due Statin: Rosuvastatin  Dietary changes since last visit: She has increased veggie consumption, eats rice daily, eats noodle soup sometimes for breakfast. She drinks mostly water.    Exercise: Active at work.       Review of Systems  Eyes:  Positive for visual disturbance.  Endocrine: Positive for polydipsia, polyphagia and polyuria.         Past Medical History:  Diagnosis Date   Hyperlipidemia    OSA (obstructive sleep apnea)    Type 2 diabetes mellitus (HCC)     Social History   Socioeconomic History   Marital status: Married    Spouse name: Not on file   Number of children: Not on file   Years of education: Not on file   Highest education level: Not on file  Occupational History   Not on file  Tobacco Use   Smoking status: Never   Smokeless tobacco: Never  Substance and Sexual Activity   Alcohol use: No   Drug use: Not on file   Sexual activity: Not on file  Other Topics Concern   Not on file  Social History Narrative   Not on file   Social Determinants of Health   Financial Resource Strain: Not on file  Food Insecurity: Not on file  Transportation Needs: Not on file  Physical Activity: Not on file  Stress: Not on file  Social Connections: Not on file  Intimate Partner Violence: Not on file    Past Surgical History:  Procedure Laterality Date   BILATERAL SALPINGECTOMY Bilateral    TOTAL VAGINAL HYSTERECTOMY      History  reviewed. No pertinent family history.  No Known Allergies  Current Outpatient Medications on File Prior to Visit  Medication Sig Dispense Refill   acetaminophen (TYLENOL) 325 MG tablet Take by mouth.     Aspirin-Salicylamide-Caffeine (ARTHRITIS STRENGTH BC POWDER PO) Take by mouth.     meloxicam (MOBIC) 7.5 MG tablet TAKE 1 TABLET (7.5 MG TOTAL) BY MOUTH AT BEDTIME AS NEEDED FOR PAIN. 90 tablet 0   rosuvastatin (CRESTOR) 5 MG tablet TAKE 1 TABLET (5 MG TOTAL) BY MOUTH DAILY FOR CHOLESTEROL 90 tablet 2   Vitamin D, Ergocalciferol, (DRISDOL) 1.25 MG (50000 UNIT) CAPS capsule Take 1 capsule by mouth once weekly for 12 weeks. (Patient not taking: Reported on 04/23/2023) 12 capsule 0   No current facility-administered medications on file prior to visit.    BP 136/78   Pulse 87   Temp (!) 97.4 F (36.3 C) (Temporal)   Ht 4\' 9"  (1.448 m)   Wt 184 lb (83.5 kg)   LMP 11/18/2016   SpO2 98%   BMI 39.82 kg/m  Objective:   Physical Exam Cardiovascular:     Rate and Rhythm: Normal rate and regular rhythm.  Pulmonary:     Effort: Pulmonary effort is normal.  Musculoskeletal:     Cervical back: Neck supple.  Skin:    General: Skin is  warm and dry.  Psychiatric:        Mood and Affect: Mood normal.           Assessment & Plan:  Type 2 diabetes mellitus with hyperglycemia, without long-term current use of insulin (HCC) Assessment & Plan: Uncontrolled with A1C today of 11.5!  Discussed this today with patient. Discussed options for treatment.  She has been on metformin previously, but given the need for weight loss we will try Ozempic.  Discussed instructions for use and potential side effects.  Start by injecting 0.25 mg into the skin once weekly for 4 weeks, then increase to 0.5 mg once weekly thereafter.   Prescription for glucometer sent to pharmacy.  Discussed instructions for how to check glucose levels and when to check glucose levels.  Will plan to see her back in 3  months, or sooner if glucose readings are not improving.  She will update.  Orders: -     POCT glycosylated hemoglobin (Hb A1C) -     Microalbumin / creatinine urine ratio -     Ozempic (0.25 or 0.5 MG/DOSE); Inject 0.25 mg into the skin once weekly for 4 weeks, then increase to 0.5 mg once weekly thereafter for diabetes.  Dispense: 9 mL; Refill: 0 -     Blood Glucose Monitoring Suppl; 1 each by Does not apply route in the morning, at noon, and at bedtime. May substitute to any manufacturer covered by patient's insurance.  Dispense: 1 each; Refill: 0 -     Blood Glucose Test; 1 each by In Vitro route in the morning, at noon, and at bedtime. May substitute to any manufacturer covered by patient's insurance.  Dispense: 300 strip; Refill: 0 -     Lancet Device; 1 each by Does not apply route in the morning, at noon, and at bedtime. May substitute to any manufacturer covered by patient's insurance.  Dispense: 1 each; Refill: 0 -     Lancets Misc.; 1 each by Does not apply route in the morning, at noon, and at bedtime. May substitute to any manufacturer covered by patient's insurance.  Dispense: 300 each; Refill: 0        Doreene Nest, NP

## 2023-04-23 NOTE — Assessment & Plan Note (Signed)
Uncontrolled with A1C today of 11.5!  Discussed this today with patient. Discussed options for treatment.  She has been on metformin previously, but given the need for weight loss we will try Ozempic.  Discussed instructions for use and potential side effects.  Start by injecting 0.25 mg into the skin once weekly for 4 weeks, then increase to 0.5 mg once weekly thereafter.   Prescription for glucometer sent to pharmacy.  Discussed instructions for how to check glucose levels and when to check glucose levels.  Will plan to see her back in 3 months, or sooner if glucose readings are not improving.  She will update.

## 2023-04-23 NOTE — Telephone Encounter (Signed)
Called patient and reviewed all information. Patient verbalized understanding. Will call if any further questions.  

## 2023-04-23 NOTE — Telephone Encounter (Signed)
Please call patient:  Let her know that Ozempic is not covered by her insurance. This is the new diabetes medication.  Let her know that I sent in a prescription for Trulicity 0.75 mg. Inject 0.75 mg once weekly. I sent a 3 month supply. Let me know if the pharmacy doesn't give her 3 months.  Have her call us once she's back in the country and update [place] with her blood sugar readings.

## 2023-04-24 LAB — MICROALBUMIN / CREATININE URINE RATIO
Creatinine,U: 93.2 mg/dL
Microalb Creat Ratio: 4.7 mg/g (ref 0.0–30.0)
Microalb, Ur: 4.4 mg/dL — ABNORMAL HIGH (ref 0.0–1.9)

## 2023-04-27 ENCOUNTER — Telehealth: Payer: Self-pay

## 2023-04-27 ENCOUNTER — Other Ambulatory Visit: Payer: Self-pay | Admitting: Primary Care

## 2023-04-27 DIAGNOSIS — E1165 Type 2 diabetes mellitus with hyperglycemia: Secondary | ICD-10-CM

## 2023-04-27 NOTE — Telephone Encounter (Signed)
Please submit PA for Trulicty.   Sending high priority as patient is leaving the country on 05/03/23

## 2023-04-28 ENCOUNTER — Telehealth: Payer: Self-pay

## 2023-04-28 ENCOUNTER — Other Ambulatory Visit (HOSPITAL_COMMUNITY): Payer: Self-pay

## 2023-04-28 NOTE — Telephone Encounter (Signed)
Pharmacy Patient Advocate Encounter   Received notification from Pt Calls Messages that prior authorization for Trulicity is required/requested.   Insurance verification completed.   The patient is insured through [facility] .   PA submitted to [facility] via CoverMyMeds Key/confirmation #/EOC [id number] Status is pending

## 2023-04-28 NOTE — Telephone Encounter (Signed)
PA has been submitted as urgent, currently pending. Documenting in separate encounter.

## 2023-04-28 NOTE — Telephone Encounter (Signed)
Pt advised of PA update.

## 2023-04-28 NOTE — Telephone Encounter (Signed)
PA has been APPROVED from 04/28/2023-04/26/2024

## 2023-05-27 ENCOUNTER — Other Ambulatory Visit: Payer: Self-pay | Admitting: Primary Care

## 2023-05-27 DIAGNOSIS — G8929 Other chronic pain: Secondary | ICD-10-CM

## 2023-07-23 ENCOUNTER — Ambulatory Visit (INDEPENDENT_AMBULATORY_CARE_PROVIDER_SITE_OTHER): Payer: 59 | Admitting: Primary Care

## 2023-07-23 ENCOUNTER — Encounter: Payer: Self-pay | Admitting: Primary Care

## 2023-07-23 VITALS — BP 128/72 | HR 94 | Temp 97.2°F | Ht <= 58 in | Wt 181.0 lb

## 2023-07-23 DIAGNOSIS — E1165 Type 2 diabetes mellitus with hyperglycemia: Secondary | ICD-10-CM

## 2023-07-23 DIAGNOSIS — Z7985 Long-term (current) use of injectable non-insulin antidiabetic drugs: Secondary | ICD-10-CM

## 2023-07-23 LAB — POCT GLYCOSYLATED HEMOGLOBIN (HGB A1C): Hemoglobin A1C: 6 % — AB (ref 4.0–5.6)

## 2023-07-23 MED ORDER — TRULICITY 0.75 MG/0.5ML ~~LOC~~ SOAJ
0.7500 mg | SUBCUTANEOUS | 0 refills | Status: DC
Start: 2023-07-23 — End: 2023-12-01

## 2023-07-23 NOTE — Patient Instructions (Signed)
Continue taking Trulicity medication for diabetes.  Please schedule a physical to meet with me in 4 months.   It was a pleasure to see you today!

## 2023-07-23 NOTE — Progress Notes (Signed)
Subjective:    Patient ID: Gloria Wade, female    DOB: [birth year], 47 y.o.   MRN: [id number]  HPI  Gloria Wade is a very pleasant 47 y.o. female with a history of type 2 diabetes, OSA, hyperlipidemia who presents today for follow-up of diabetes.  Current medications include: Trulicity 0.75 mg weekly. She is tolerating well.   She is checking her blood glucose on occasion and is getting readings of 130, last night 102  Last A1C: 11.5 in June 2024, 6.0 today Last Eye Exam: Due Last Foot Exam: Up-to-date Pneumonia Vaccination: Urine Microalbumin: Up-to-date Statin: Rosuvastatin  Dietary changes since last visit: Reduced portion sizes.    Exercise: Active at work  [employer] from Last 3 Encounters:  07/23/23 181 lb (82.1 kg)  04/23/23 184 lb (83.5 kg)  10/28/22 184 lb (83.5 kg)       Review of Systems  Respiratory:  Negative for shortness of breath.   Cardiovascular:  Negative for chest pain.  Gastrointestinal:  Negative for constipation and nausea.  Neurological:  Negative for numbness.         Past Medical History:  Diagnosis Date   Hyperlipidemia    OSA (obstructive sleep apnea)    Type 2 diabetes mellitus (HCC)     Social History   Socioeconomic History   Marital status: Married    Spouse name: Not on file   Number of children: Not on file   Years of education: Not on file   Highest education level: Not on file  Occupational History   Not on file  Tobacco Use   Smoking status: Never   Smokeless tobacco: Never  Substance and Sexual Activity   Alcohol use: No   Drug use: Not on file   Sexual activity: Not on file  Other Topics Concern   Not on file  Social History Narrative   Not on file   Social Determinants of Health   Financial Resource Strain: Not on file  Food Insecurity: Not on file  Transportation Needs: Not on file  Physical Activity: Not on file  Stress: Not on file  Social Connections: Not on file  Intimate Partner Violence:  Not on file    Past Surgical History:  Procedure Laterality Date   BILATERAL SALPINGECTOMY Bilateral    TOTAL VAGINAL HYSTERECTOMY      History reviewed. No pertinent family history.  No Known Allergies  Current Outpatient Medications on File Prior to Visit  Medication Sig Dispense Refill   acetaminophen (TYLENOL) 325 MG tablet Take by mouth.     Aspirin-Salicylamide-Caffeine (ARTHRITIS STRENGTH BC POWDER PO) Take by mouth.     Blood Glucose Monitoring Suppl DEVI 1 each by Does not apply route in the morning, at noon, and at bedtime. May substitute to any manufacturer covered by patient's insurance. 1 each 0   Glucose Blood (BLOOD GLUCOSE TEST STRIPS) STRP 1 each by In Vitro route in the morning, at noon, and at bedtime. May substitute to any manufacturer covered by patient's insurance. 300 strip 0   Lancets Misc. MISC 1 each by Does not apply route in the morning, at noon, and at bedtime. May substitute to any manufacturer covered by patient's insurance. 300 each 0   meloxicam (MOBIC) 7.5 MG tablet TAKE 1 TABLET (7.5 MG TOTAL) BY MOUTH AT BEDTIME AS NEEDED FOR PAIN. 90 tablet 0   rosuvastatin (CRESTOR) 5 MG tablet TAKE 1 TABLET (5 MG TOTAL) BY MOUTH DAILY FOR CHOLESTEROL 90 tablet 2  Vitamin D, Ergocalciferol, (DRISDOL) 1.25 MG (50000 UNIT) CAPS capsule Take 1 capsule by mouth once weekly for 12 weeks. 12 capsule 0   No current facility-administered medications on file prior to visit.    BP 128/72   Pulse 94   Temp (!) 97.2 F (36.2 C) (Temporal)   Ht 4\' 9"  (1.448 m)   Wt 181 lb (82.1 kg)   LMP 11/18/2016   SpO2 97%   BMI 39.17 kg/m  Objective:   Physical Exam Cardiovascular:     Rate and Rhythm: Normal rate and regular rhythm.  Pulmonary:     Effort: Pulmonary effort is normal.     Breath sounds: Normal breath sounds.  Musculoskeletal:     Cervical back: Neck supple.  Skin:    General: Skin is warm and dry.           Assessment & Plan:  Type 2 diabetes  mellitus with hyperglycemia, without long-term current use of insulin (HCC) Assessment & Plan: Improved and controlled with A1c of 6.0 today!  Commended her on reduction of portion sizes!  Continue Trulicity 0.75 mg weekly. Follow-up in early 2025 for annual physical.  Discussed to schedule eye exam.  Orders: -     POCT glycosylated hemoglobin (Hb A1C) -     Trulicity; Inject 0.75 mg into the skin once a week. for diabetes.  Dispense: 6 mL; Refill: 0        Doreene Nest, NP

## 2023-07-23 NOTE — Assessment & Plan Note (Signed)
Improved and controlled with A1c of 6.0 today!  Commended her on reduction of portion sizes!  Continue Trulicity 0.75 mg weekly. Follow-up in early 2025 for annual physical.  Discussed to schedule eye exam.

## 2023-09-05 ENCOUNTER — Other Ambulatory Visit: Payer: Self-pay | Admitting: Primary Care

## 2023-09-05 DIAGNOSIS — G8929 Other chronic pain: Secondary | ICD-10-CM

## 2023-11-10 ENCOUNTER — Encounter: Payer: 59 | Admitting: Primary Care

## 2023-11-18 ENCOUNTER — Encounter: Payer: 59 | Admitting: Primary Care

## 2023-12-01 ENCOUNTER — Encounter: Payer: Self-pay | Admitting: Primary Care

## 2023-12-01 ENCOUNTER — Ambulatory Visit (INDEPENDENT_AMBULATORY_CARE_PROVIDER_SITE_OTHER): Payer: 59 | Admitting: Primary Care

## 2023-12-01 VITALS — BP 154/98 | HR 73 | Temp 97.3°F | Ht <= 58 in | Wt 187.0 lb

## 2023-12-01 DIAGNOSIS — M545 Low back pain, unspecified: Secondary | ICD-10-CM

## 2023-12-01 DIAGNOSIS — E559 Vitamin D deficiency, unspecified: Secondary | ICD-10-CM

## 2023-12-01 DIAGNOSIS — M25561 Pain in right knee: Secondary | ICD-10-CM

## 2023-12-01 DIAGNOSIS — Z7985 Long-term (current) use of injectable non-insulin antidiabetic drugs: Secondary | ICD-10-CM

## 2023-12-01 DIAGNOSIS — M25562 Pain in left knee: Secondary | ICD-10-CM

## 2023-12-01 DIAGNOSIS — E785 Hyperlipidemia, unspecified: Secondary | ICD-10-CM

## 2023-12-01 DIAGNOSIS — Z1231 Encounter for screening mammogram for malignant neoplasm of breast: Secondary | ICD-10-CM

## 2023-12-01 DIAGNOSIS — Z0000 Encounter for general adult medical examination without abnormal findings: Secondary | ICD-10-CM

## 2023-12-01 DIAGNOSIS — E1165 Type 2 diabetes mellitus with hyperglycemia: Secondary | ICD-10-CM

## 2023-12-01 DIAGNOSIS — G8929 Other chronic pain: Secondary | ICD-10-CM

## 2023-12-01 DIAGNOSIS — Z1211 Encounter for screening for malignant neoplasm of colon: Secondary | ICD-10-CM

## 2023-12-01 LAB — POCT GLYCOSYLATED HEMOGLOBIN (HGB A1C): Hemoglobin A1C: 6.3 % — AB (ref 4.0–5.6)

## 2023-12-01 MED ORDER — TRULICITY 0.75 MG/0.5ML ~~LOC~~ SOAJ: 0.7500 mg | SUBCUTANEOUS | 1 refills | Status: DC

## 2023-12-01 NOTE — Progress Notes (Signed)
Subjective:    Patient ID: Gloria Wade, female    DOB: 1976-09-22, 48 y.o.   MRN: [id number]  HPI  Gloria Wade is a very pleasant 48 y.o. female who presents today for complete physical and follow up of chronic conditions.  Immunizations: -Tetanus: Completed in 2021 -Influenza: Declines influenza vaccine.   Diet: Fair diet.  Exercise: No regular exercise.  Eye exam: Completed 2 years ago  Dental exam: Completed 2 years ago  Pap Smear: Complete hysterectomy  Mammogram: Completed last in 2020  Colonoscopy: Never completed   BP Readings from Last 3 Encounters:  12/01/23 (!) 154/98  07/23/23 128/72  04/23/23 136/78       Review of Systems  Constitutional:  Negative for unexpected weight change.  HENT:  Negative for rhinorrhea.   Respiratory:  Negative for cough and shortness of breath.   Cardiovascular:  Negative for chest pain.  Gastrointestinal:  Negative for constipation and diarrhea.  Genitourinary:  Negative for difficulty urinating.  Musculoskeletal:  Positive for arthralgias.  Skin:  Negative for rash.  Allergic/Immunologic: Negative for environmental allergies.  Neurological:  Negative for dizziness, numbness and headaches.  Psychiatric/Behavioral:  The patient is nervous/anxious.          Past Medical History:  Diagnosis Date   Hyperlipidemia    OSA (obstructive sleep apnea)    Type 2 diabetes mellitus (HCC)     Social History   Socioeconomic History   Marital status: Married    Spouse name: Not on file   Number of children: Not on file   Years of education: Not on file   Highest education level: Not on file  Occupational History   Not on file  Tobacco Use   Smoking status: Never   Smokeless tobacco: Never  Substance and Sexual Activity   Alcohol use: No   Drug use: Not on file   Sexual activity: Not on file  Other Topics Concern   Not on file  Social History Narrative   Not on file   Social Drivers of Health   Financial  Resource Strain: Not on file  Food Insecurity: Not on file  Transportation Needs: Not on file  Physical Activity: Not on file  Stress: Not on file  Social Connections: Not on file  Intimate Partner Violence: Not on file    Past Surgical History:  Procedure Laterality Date   BILATERAL SALPINGECTOMY Bilateral    TOTAL VAGINAL HYSTERECTOMY      No family history on file.  No Known Allergies  Current Outpatient Medications on File Prior to Visit  Medication Sig Dispense Refill   acetaminophen (TYLENOL) 325 MG tablet Take by mouth.     Aspirin-Salicylamide-Caffeine (ARTHRITIS STRENGTH BC POWDER PO) Take by mouth.     Blood Glucose Monitoring Suppl DEVI 1 each by Does not apply route in the morning, at noon, and at bedtime. May substitute to any manufacturer covered by patient's insurance. 1 each 0   Glucose Blood (BLOOD GLUCOSE TEST STRIPS) STRP 1 each by In Vitro route in the morning, at noon, and at bedtime. May substitute to any manufacturer covered by patient's insurance. 300 strip 0   Lancets Misc. MISC 1 each by Does not apply route in the morning, at noon, and at bedtime. May substitute to any manufacturer covered by patient's insurance. 300 each 0   rosuvastatin (CRESTOR) 5 MG tablet TAKE 1 TABLET (5 MG TOTAL) BY MOUTH DAILY FOR CHOLESTEROL 90 tablet 2   meloxicam (MOBIC) 7.5 MG tablet  TAKE 1 TAB BY MOUTH AT BEDTIME AS NEEDED FOR PAIN. (Patient not taking: Reported on 12/01/2023) 90 tablet 0   No current facility-administered medications on file prior to visit.    BP (!) 154/98   Pulse 73   Temp (!) 97.3 F (36.3 C) (Temporal)   Ht 4\' 9"  (1.448 m)   Wt 187 lb (84.8 kg)   LMP 11/18/2016   SpO2 98%   BMI 40.47 kg/m  Objective:   Physical Exam HENT:     Right Ear: Tympanic membrane and ear canal normal.     Left Ear: Tympanic membrane and ear canal normal.  Eyes:     Pupils: Pupils are equal, round, and reactive to light.  Cardiovascular:     Rate and Rhythm: Normal  rate and regular rhythm.  Pulmonary:     Effort: Pulmonary effort is normal.     Breath sounds: Normal breath sounds.  Abdominal:     General: Bowel sounds are normal.     Palpations: Abdomen is soft.     Tenderness: There is no abdominal tenderness.  Musculoskeletal:        General: Normal range of motion.     Cervical back: Neck supple.  Skin:    General: Skin is warm and dry.  Neurological:     Mental Status: She is alert and oriented to person, place, and time.     Cranial Nerves: No cranial nerve deficit.     Deep Tendon Reflexes:     Reflex Scores:      Patellar reflexes are 2+ on the right side and 2+ on the left side. Psychiatric:        Mood and Affect: Mood normal.           Assessment & Plan:  Preventative health care Assessment & Plan: Immunizations UTD. Declines influenza vaccine.  Mammogram due, orders placed. Colonoscopy due, she declines but agrees for Cologuard. Orders placed.   Discussed the importance of a healthy diet and regular exercise in order for weight loss, and to reduce the risk of further co-morbidity.  Exam stable. Labs pending.  Follow up in 1 year for repeat physical.    Screening mammogram for breast cancer -     3D Screening Mammogram, Left and Right; Future  Screening for colon cancer -     Cologuard  Type 2 diabetes mellitus with hyperglycemia, without long-term current use of insulin (HCC) Assessment & Plan: Slightly deteriorated with A1c of 6.3 today.  Has also been out of Trulicity for 1 month  Resume Trulicity 0.75 mg weekly for diabetes.  Follow-up in 6 months.  Orders: -     POCT glycosylated hemoglobin (Hb A1C) -     CBC with Differential/Platelet -     Trulicity; Inject 0.75 mg into the skin once a week. for diabetes.  Dispense: 6 mL; Refill: 1  Vitamin D deficiency Assessment & Plan: Repeat vitamin D level pending. Consider resuming 50,000 IU capsules once weekly.  Orders: -     VITAMIN D 25 Hydroxy  (Vit-D Deficiency, Fractures)  Hyperlipidemia, unspecified hyperlipidemia type Assessment & Plan: Repeat lipid panel pending.  Discussed the importance of a healthy diet and regular exercise in order for weight loss, and to reduce the risk of further co-morbidity. Continue rosuvastatin 5 mg daily.  Orders: -     Comprehensive metabolic panel -     Lipid panel  Chronic bilateral low back pain without sciatica Assessment & Plan: Improved.  Continue Meloxicam  7.5 mg BID PRN.   Chronic pain of both knees Assessment & Plan: Improved.  Continue Meloxicam 7.5 mg BID         Doreene Nest, NP

## 2023-12-01 NOTE — Assessment & Plan Note (Signed)
Repeat lipid panel pending.  Discussed the importance of a healthy diet and regular exercise in order for weight loss, and to reduce the risk of further co-morbidity. Continue rosuvastatin 5 mg daily.

## 2023-12-01 NOTE — Assessment & Plan Note (Signed)
Immunizations UTD. Declines influenza vaccine.  Mammogram due, orders placed. Colonoscopy due, she declines but agrees for Cologuard. Orders placed.   Discussed the importance of a healthy diet and regular exercise in order for weight loss, and to reduce the risk of further co-morbidity.  Exam stable. Labs pending.  Follow up in 1 year for repeat physical.

## 2023-12-01 NOTE — Assessment & Plan Note (Signed)
Repeat vitamin D level pending. Consider resuming 50,000 IU capsules once weekly.

## 2023-12-01 NOTE — Assessment & Plan Note (Signed)
Improved.  Continue Meloxicam 7.5 mg BID PRN.

## 2023-12-01 NOTE — Assessment & Plan Note (Signed)
Improved.  Continue Meloxicam 7.5 mg BID

## 2023-12-01 NOTE — Assessment & Plan Note (Signed)
Slightly deteriorated with A1c of 6.3 today.  Has also been out of Trulicity for 1 month  Resume Trulicity 0.75 mg weekly for diabetes.  Follow-up in 6 months.

## 2023-12-01 NOTE — Patient Instructions (Signed)
Stop by the lab prior to leaving today. I will notify you of your results once received.   Complete the Cologuard Kit once received. This is for colon cancer screening.  Resume Trulicity 0.75 mg once weekly for diabetes.  Please schedule a follow up visit for 6 months for a diabetes check.  It was a pleasure to see you today!

## 2023-12-02 LAB — CBC WITH DIFFERENTIAL/PLATELET
Basophils Absolute: 0.1 10*3/uL (ref 0.0–0.1)
Basophils Relative: 1.3 % (ref 0.0–3.0)
Eosinophils Absolute: 0.3 10*3/uL (ref 0.0–0.7)
Eosinophils Relative: 3.7 % (ref 0.0–5.0)
HCT: 40.6 % (ref 36.0–46.0)
Hemoglobin: 13.2 g/dL (ref 12.0–15.0)
Lymphocytes Relative: 23.7 % (ref 12.0–46.0)
Lymphs Abs: 2.2 10*3/uL (ref 0.7–4.0)
MCHC: 32.7 g/dL (ref 30.0–36.0)
MCV: 81.3 fL (ref 78.0–100.0)
Monocytes Absolute: 0.6 10*3/uL (ref 0.1–1.0)
Monocytes Relative: 6.1 % (ref 3.0–12.0)
Neutro Abs: 5.9 10*3/uL (ref 1.4–7.7)
Neutrophils Relative %: 65.2 % (ref 43.0–77.0)
Platelets: 264 10*3/uL (ref 150.0–400.0)
RBC: 4.99 Mil/uL (ref 3.87–5.11)
RDW: 14 % (ref 11.5–15.5)
WBC: 9.1 10*3/uL (ref 4.0–10.5)

## 2023-12-02 LAB — COMPREHENSIVE METABOLIC PANEL
ALT: 22 U/L (ref 0–35)
AST: 21 U/L (ref 0–37)
Albumin: 4.1 g/dL (ref 3.5–5.2)
Alkaline Phosphatase: 53 U/L (ref 39–117)
BUN: 19 mg/dL (ref 6–23)
CO2: 28 meq/L (ref 19–32)
Calcium: 9.3 mg/dL (ref 8.4–10.5)
Chloride: 102 meq/L (ref 96–112)
Creatinine, Ser: 0.94 mg/dL (ref 0.40–1.20)
GFR: 71.22 mL/min (ref >60.00)
Glucose, Bld: 116 mg/dL — ABNORMAL HIGH (ref 70–99)
Potassium: 4 meq/L (ref 3.5–5.1)
Sodium: 137 meq/L (ref 135–145)
Total Bilirubin: 0.3 mg/dL (ref 0.2–1.2)
Total Protein: 6.9 g/dL (ref 6.0–8.3)

## 2023-12-02 LAB — LIPID PANEL
Cholesterol: 229 mg/dL — ABNORMAL HIGH (ref 0–200)
HDL: 43.3 mg/dL (ref >39.00)
NonHDL: 185.34
Total CHOL/HDL Ratio: 5
Triglycerides: 695 mg/dL — ABNORMAL HIGH (ref 0.0–149.0)
VLDL: 139 mg/dL — ABNORMAL HIGH (ref 0.0–40.0)

## 2023-12-02 LAB — LDL CHOLESTEROL, DIRECT: Direct LDL: 138 mg/dL

## 2023-12-02 LAB — VITAMIN D 25 HYDROXY (VIT D DEFICIENCY, FRACTURES): VITD: 17.16 ng/mL — ABNORMAL LOW (ref 30.00–100.00)

## 2023-12-07 ENCOUNTER — Other Ambulatory Visit: Payer: Self-pay | Admitting: Primary Care

## 2023-12-07 DIAGNOSIS — E785 Hyperlipidemia, unspecified: Secondary | ICD-10-CM

## 2023-12-11 LAB — COLOGUARD

## 2023-12-21 ENCOUNTER — Other Ambulatory Visit (INDEPENDENT_AMBULATORY_CARE_PROVIDER_SITE_OTHER): Payer: 59

## 2023-12-21 DIAGNOSIS — E785 Hyperlipidemia, unspecified: Secondary | ICD-10-CM | POA: Diagnosis not present

## 2023-12-21 LAB — LIPID PANEL
Cholesterol: 158 mg/dL (ref 0–200)
HDL: 49.1 mg/dL (ref >39.00)
LDL Cholesterol: 76 mg/dL (ref 0–99)
NonHDL: 108.83
Total CHOL/HDL Ratio: 3
Triglycerides: 162 mg/dL — ABNORMAL HIGH (ref 0.0–149.0)
VLDL: 32.4 mg/dL (ref 0.0–40.0)

## 2023-12-22 ENCOUNTER — Ambulatory Visit
Admission: RE | Admit: 2023-12-22 | Discharge: 2023-12-22 | Disposition: A | Discharge status: Home / Self Care | Payer: 59 | Source: Ambulatory Visit | Attending: Primary Care | Admitting: Primary Care

## 2023-12-22 DIAGNOSIS — Z1211 Encounter for screening for malignant neoplasm of colon: Secondary | ICD-10-CM | POA: Diagnosis not present

## 2023-12-22 DIAGNOSIS — Z1231 Encounter for screening mammogram for malignant neoplasm of breast: Secondary | ICD-10-CM | POA: Insufficient documentation

## 2023-12-29 LAB — COLOGUARD: COLOGUARD: NEGATIVE

## 2023-12-31 ENCOUNTER — Other Ambulatory Visit: Payer: Self-pay | Admitting: Primary Care

## 2023-12-31 DIAGNOSIS — E559 Vitamin D deficiency, unspecified: Secondary | ICD-10-CM

## 2024-01-25 ENCOUNTER — Other Ambulatory Visit: Payer: Self-pay | Admitting: Primary Care

## 2024-01-25 DIAGNOSIS — E785 Hyperlipidemia, unspecified: Secondary | ICD-10-CM

## 2024-03-25 ENCOUNTER — Encounter: Payer: Self-pay | Admitting: Primary Care

## 2024-03-25 ENCOUNTER — Ambulatory Visit: Admitting: Primary Care

## 2024-03-25 VITALS — BP 118/80 | HR 65 | Temp 97.7°F | Ht <= 58 in | Wt 188.0 lb

## 2024-03-25 DIAGNOSIS — J392 Other diseases of pharynx: Secondary | ICD-10-CM | POA: Diagnosis not present

## 2024-03-25 NOTE — Progress Notes (Signed)
 Subjective:    Patient ID: Gloria Wade, female    DOB: 18-Dec-1975, 48 y.o.   MRN: [id number]  Oral Pain     Gloria Wade is a very pleasant 48 y.o. female with a history of OSA, type 2 diabetes, hyperlipidemia who presents today to discuss oral swelling.   About 2 weeks ago she noticed a white spot growing on the tip of the uvula. She's also noticed a change in her taste and a sensation of something down her throat.  She denies dental pain, throat swelling, uvular swelling, fevers, changes in medications or supplements, cough rhinorrhea, post nasal drip.   She has noticed esophageal burning morning and evening. Doesn't take anything regularly.    Review of Systems  HENT:  Negative for rhinorrhea, sore throat and trouble swallowing.   Respiratory:  Negative for cough.          Past Medical History:  Diagnosis Date   Hyperlipidemia    OSA (obstructive sleep apnea)    Type 2 diabetes mellitus (HCC)     Social History   Socioeconomic History   Marital status: Married    Spouse name: Not on file   Number of children: Not on file   Years of education: Not on file   Highest education level: Not on file  Occupational History   Not on file  Tobacco Use   Smoking status: Never   Smokeless tobacco: Never  Substance and Sexual Activity   Alcohol use: No   Drug use: Not on file   Sexual activity: Not on file  Other Topics Concern   Not on file  Social History Narrative   Not on file   Social Drivers of Health   Financial Resource Strain: Not on file  Food Insecurity: Not on file  Transportation Needs: Not on file  Physical Activity: Not on file  Stress: Not on file  Social Connections: Not on file  Intimate Partner Violence: Not on file    Past Surgical History:  Procedure Laterality Date   BILATERAL SALPINGECTOMY Bilateral    TOTAL VAGINAL HYSTERECTOMY      History reviewed. No pertinent family history.  No Known Allergies  Current Outpatient  Medications on File Prior to Visit  Medication Sig Dispense Refill   acetaminophen (TYLENOL) 325 MG tablet Take by mouth.     Aspirin-Salicylamide-Caffeine (ARTHRITIS STRENGTH BC POWDER PO) Take by mouth.     Blood Glucose Monitoring Suppl DEVI 1 each by Does not apply route in the morning, at noon, and at bedtime. May substitute to any manufacturer covered by patient's insurance. 1 each 0   Dulaglutide  (TRULICITY ) 0.75 MG/0.5ML SOAJ Inject 0.75 mg into the skin once a week. for diabetes. 6 mL 1   Glucose Blood (BLOOD GLUCOSE TEST STRIPS) STRP 1 each by In Vitro route in the morning, at noon, and at bedtime. May substitute to any manufacturer covered by patient's insurance. 300 strip 0   Lancets Misc. MISC 1 each by Does not apply route in the morning, at noon, and at bedtime. May substitute to any manufacturer covered by patient's insurance. 300 each 0   meloxicam  (MOBIC ) 7.5 MG tablet TAKE 1 TAB BY MOUTH AT BEDTIME AS NEEDED FOR PAIN. 90 tablet 0   rosuvastatin  (CRESTOR ) 5 MG tablet TAKE 1 TABLET (5 MG TOTAL) BY MOUTH DAILY FOR CHOLESTEROL 90 tablet 2   Vitamin D , Ergocalciferol , (DRISDOL ) 1.25 MG (50000 UNIT) CAPS capsule TAKE 1 CAPSULE BY MOUTH ONCE WEEKLY FOR 12 WEEKS.  12 capsule 0   No current facility-administered medications on file prior to visit.    BP 118/80   Pulse 65   Temp 97.7 F (36.5 C) (Temporal)   Ht 4\' 9"  (1.448 m)   Wt 188 lb (85.3 kg)   LMP 11/18/2016   SpO2 96%   BMI 40.68 kg/m  Objective:   Physical Exam HENT:     Mouth/Throat:     Lips: Pink.     Mouth: Mucous membranes are moist.     Dentition: Normal dentition.     Tongue: No lesions.     Palate: No mass.     Tonsils: No tonsillar exudate.      Comments: Small white mass on tip of uvula. Whitish substance to posterior pharynx.  Cardiovascular:     Rate and Rhythm: Normal rate.  Pulmonary:     Effort: Pulmonary effort is normal.           Assessment & Plan:  Throat irritation Assessment &  Plan: Exam today with unclear etiology.  Her throat irritation could be from untreated GERD. Start famotidine 20 mg 1-2 times daily. Start warm salt water gargles 3 times daily.  Referral placed to ENT for further evaluation.  Orders: -     Ambulatory referral to ENT        Gabriel John, NP

## 2024-03-25 NOTE — Patient Instructions (Signed)
 You will receive a phone call from ENT  Start taking famotidine (Pepcid) 20 mg tablets for throat irritation.  Take 1 tablet by mouth once or twice daily for throat irritation. You can find this over the counter at any drug store, [employer], [employer], etc.  Start gargling with warm salt water 3 times daily.  It was a pleasure to see you today!

## 2024-03-25 NOTE — Assessment & Plan Note (Signed)
 Exam today with unclear etiology.  Her throat irritation could be from untreated GERD. Start famotidine 20 mg 1-2 times daily. Start warm salt water gargles 3 times daily.  Referral placed to ENT for further evaluation.

## 2024-04-01 ENCOUNTER — Other Ambulatory Visit: Payer: Self-pay | Admitting: Primary Care

## 2024-04-01 DIAGNOSIS — E559 Vitamin D deficiency, unspecified: Secondary | ICD-10-CM

## 2024-04-13 DIAGNOSIS — K1379 Other lesions of oral mucosa: Secondary | ICD-10-CM | POA: Diagnosis not present

## 2024-04-13 DIAGNOSIS — D1039 Benign neoplasm of other parts of mouth: Secondary | ICD-10-CM | POA: Diagnosis not present

## 2024-04-13 DIAGNOSIS — D3705 Neoplasm of uncertain behavior of pharynx: Secondary | ICD-10-CM | POA: Diagnosis not present

## 2024-06-01 ENCOUNTER — Ambulatory Visit: Payer: 59 | Admitting: Primary Care

## 2024-06-07 ENCOUNTER — Ambulatory Visit (INDEPENDENT_AMBULATORY_CARE_PROVIDER_SITE_OTHER): Admitting: Primary Care

## 2024-06-07 ENCOUNTER — Encounter: Payer: Self-pay | Admitting: Primary Care

## 2024-06-07 VITALS — BP 122/78 | HR 80 | Temp 97.3°F | Ht <= 58 in | Wt 184.0 lb

## 2024-06-07 DIAGNOSIS — Z23 Encounter for immunization: Secondary | ICD-10-CM

## 2024-06-07 DIAGNOSIS — E1165 Type 2 diabetes mellitus with hyperglycemia: Secondary | ICD-10-CM

## 2024-06-07 DIAGNOSIS — G4733 Obstructive sleep apnea (adult) (pediatric): Secondary | ICD-10-CM | POA: Diagnosis not present

## 2024-06-07 DIAGNOSIS — E559 Vitamin D deficiency, unspecified: Secondary | ICD-10-CM | POA: Diagnosis not present

## 2024-06-07 LAB — POCT GLYCOSYLATED HEMOGLOBIN (HGB A1C): Hemoglobin A1C: 6.4 % — AB (ref 4.0–5.6)

## 2024-06-07 NOTE — Assessment & Plan Note (Signed)
 Referral placed back to pulmonology for CPAP options.

## 2024-06-07 NOTE — Assessment & Plan Note (Signed)
 Controlled with A1c of 6.4 today.  Remain off treatment for now. Foot exam today. Pneumonia vaccine provided today.  Follow up 6 months.

## 2024-06-07 NOTE — Assessment & Plan Note (Signed)
Repeat vitamin D level pending. 

## 2024-06-07 NOTE — Progress Notes (Signed)
 Subjective:    Patient ID: Gloria Wade, female    DOB: 10/09/1976, 48 y.o.   MRN: [id number]  HPI  Gloria Wade is a very pleasant 48 y.o. female with a history of OSA, type 2 diabetes, hyperlipidemia, vitamin D  deficiency who presents today for follow-up of diabetes and to discuss sleep apnea.   1) Type 2 Diabetes: Current medications include: Trulicity  0.75 mg weekly. She is not [place] as insurance would not cover.   She is checking her blood glucose 0 times daily.  Last A1C: 6.3 in January 2025, 6.4 today Last Eye Exam: Due soon Last Foot Exam: Due Pneumonia Vaccination:  Urine Microalbumin: Due Statin: Rosuvastatin   Dietary changes since last visit: Mostly home cooked meals.    Exercise: Active at work.   2) Sleep Apnea: Previously following with pulmonology, diagnosed with severe sleep apnea in 2021.  At the time she was provided with a CPAP machine which she used temporarily given the discomfort. She has not followed up with pulmonology since 2021.     Review of Systems  Respiratory:  Negative for shortness of breath.   Cardiovascular:  Negative for chest pain.      Past Medical History:  Diagnosis Date   Hyperlipidemia    OSA (obstructive sleep apnea)    Type 2 diabetes mellitus (HCC)     Social History   Socioeconomic History   Marital status: Married    Spouse name: Not on file   Number of children: Not on file   Years of education: Not on file   Highest education level: Not on file  Occupational History   Not on file  Tobacco Use   Smoking status: Never   Smokeless tobacco: Never  Substance and Sexual Activity   Alcohol use: No   Drug use: Not on file   Sexual activity: Not on file  Other Topics Concern   Not on file  Social History Narrative   Not on file   Social Drivers of Health   Financial Resource Strain: Not on file  Food Insecurity: Not on file  Transportation Needs: Not on file  Physical Activity: Not on file  Stress: Not on  file  Social Connections: Not on file  Intimate Partner Violence: Not on file    Past Surgical History:  Procedure Laterality Date   BILATERAL SALPINGECTOMY Bilateral    TOTAL VAGINAL HYSTERECTOMY      History reviewed. No pertinent family history.  No Known Allergies  Current Outpatient Medications on File Prior to Visit  Medication Sig Dispense Refill   acetaminophen (TYLENOL) 325 MG tablet Take by mouth.     Aspirin-Salicylamide-Caffeine (ARTHRITIS STRENGTH BC POWDER PO) Take by mouth.     Blood Glucose Monitoring Suppl DEVI 1 each by Does not apply route in the morning, at noon, and at bedtime. May substitute to any manufacturer covered by patient's insurance. 1 each 0   Glucose Blood (BLOOD GLUCOSE TEST STRIPS) STRP 1 each by In Vitro route in the morning, at noon, and at bedtime. May substitute to any manufacturer covered by patient's insurance. 300 strip 0   Lancets Misc. MISC 1 each by Does not apply route in the morning, at noon, and at bedtime. May substitute to any manufacturer covered by patient's insurance. 300 each 0   rosuvastatin  (CRESTOR ) 5 MG tablet TAKE 1 TABLET (5 MG TOTAL) BY MOUTH DAILY FOR CHOLESTEROL 90 tablet 2   Vitamin D , Ergocalciferol , (DRISDOL ) 1.25 MG (50000 UNIT) CAPS capsule TAKE 1  CAPSULE BY MOUTH ONCE WEEKLY FOR 12 WEEKS. 12 capsule 0   Dulaglutide  (TRULICITY ) 0.75 MG/0.5ML SOAJ Inject 0.75 mg into the skin once a week. for diabetes. (Patient not taking: Reported on 06/07/2024) 6 mL 1   meloxicam  (MOBIC ) 7.5 MG tablet TAKE 1 TAB BY MOUTH AT BEDTIME AS NEEDED FOR PAIN. (Patient not taking: Reported on 06/07/2024) 90 tablet 0   No current facility-administered medications on file prior to visit.    BP 122/78   Pulse 80   Temp (!) 97.3 F (36.3 C) (Temporal)   Ht 4' 9 (1.448 m)   Wt 184 lb (83.5 kg)   LMP 11/18/2016   SpO2 98%   BMI 39.82 kg/m  Objective:   Physical Exam Cardiovascular:     Rate and Rhythm: Normal rate and regular rhythm.   Pulmonary:     Effort: Pulmonary effort is normal.     Breath sounds: Normal breath sounds.  Musculoskeletal:     Cervical back: Neck supple.  Skin:    General: Skin is warm and dry.  Neurological:     Mental Status: She is alert and oriented to person, place, and time.  Psychiatric:        Mood and Affect: Mood normal.           Assessment & Plan:  Type 2 diabetes mellitus with hyperglycemia, without long-term current use of insulin (HCC) Assessment & Plan: Controlled with A1c of 6.4 today.  Remain off treatment for now. Foot exam today. Pneumonia vaccine provided today.  Follow up 6 months.  Orders: -     POCT glycosylated hemoglobin (Hb A1C)  OSA (obstructive sleep apnea) Assessment & Plan: Referral placed back to pulmonology for CPAP options.  Orders: -     Pulmonary Visit  Vitamin D  deficiency Assessment & Plan: Repeat vitamin D  level pending.  Orders: -     VITAMIN D  25 Hydroxy (Vit-D Deficiency, Fractures)        Keywon Mestre K Laityn Bensen, NP

## 2024-06-07 NOTE — Patient Instructions (Signed)
 Stop by the lab prior to leaving today. I will notify you of your results once received.   You will receive a phone call regarding the referral to pulmonology for CPAP.  Please schedule a physical to meet with me in 6 months.   It was a pleasure to see you today!

## 2024-06-08 ENCOUNTER — Ambulatory Visit: Payer: Self-pay | Admitting: Primary Care

## 2024-06-08 LAB — VITAMIN D 25 HYDROXY (VIT D DEFICIENCY, FRACTURES): VITD: 36.3 ng/mL (ref 30.00–100.00)

## 2024-06-17 ENCOUNTER — Other Ambulatory Visit: Payer: Self-pay | Admitting: Primary Care

## 2024-06-17 DIAGNOSIS — E559 Vitamin D deficiency, unspecified: Secondary | ICD-10-CM

## 2024-06-21 ENCOUNTER — Ambulatory Visit (INDEPENDENT_AMBULATORY_CARE_PROVIDER_SITE_OTHER): Admitting: Sleep Medicine

## 2024-06-21 ENCOUNTER — Encounter: Payer: Self-pay | Admitting: Sleep Medicine

## 2024-06-21 VITALS — BP 120/76 | HR 80 | Temp 97.1°F | Ht <= 58 in | Wt 182.0 lb

## 2024-06-21 DIAGNOSIS — G4733 Obstructive sleep apnea (adult) (pediatric): Secondary | ICD-10-CM | POA: Diagnosis not present

## 2024-06-21 DIAGNOSIS — Z6839 Body mass index (BMI) 39.0-39.9, adult: Secondary | ICD-10-CM

## 2024-06-21 DIAGNOSIS — G4726 Circadian rhythm sleep disorder, shift work type: Secondary | ICD-10-CM | POA: Diagnosis not present

## 2024-06-21 DIAGNOSIS — E669 Obesity, unspecified: Secondary | ICD-10-CM

## 2024-06-21 NOTE — Patient Instructions (Signed)
 Gloria Wade

## 2024-06-21 NOTE — Progress Notes (Unsigned)
 Name:Gloria Wade MRN: [id number] DOB: [birth year]   CHIEF COMPLAINT:  EXCESSIVE DAYTIME SLEEPINESS   HISTORY OF PRESENT ILLNESS:  Gloria Wade is a 48 y.o. w/ a h/o OSA, hyperlipidemia and obesity who presents for reassessment of OSA. Reports that she was initially diagnosed with severe OSA in 2021 and subsequently started on CPAP therapy which she was not able to tolerate. Reports weight gain over the last few years. Admits to loud snoring, witnessed apnea and excessive daytime sleepiness. Reports nocturnal awakenings due to nocturia, however does not have difficulty falling back to sleep. Admits to night sweats and dry mouth. Denies morning headaches, RLS symptoms, dream enactment, cataplexy, hypnagogic or hypnapompic hallucinations. Denies a family history of sleep apnea. Reports drowsy driving. Drinks 1 cup of coffee daily, occasional alcohol use, denies tobacco or illicit drug use.   Bedtime 2-4 pm Sleep onset 10 mins Rise time 11 pm   EPWORTH SLEEP SCORE     03/21/2020    2:00 PM  Results of the Epworth flowsheet  Sitting and reading 3  Watching TV 3  Sitting, inactive in a public place (e.g. a theatre or a meeting) 3  As a passenger in a car for an hour without a break 3  Lying down to rest in the afternoon when circumstances permit 3  Sitting and talking to someone 3  Sitting quietly after a lunch without alcohol 3  In a car, while stopped for a few minutes in traffic 3  Total score 24    PAST MEDICAL HISTORY :   has a past medical history of Hyperlipidemia, OSA (obstructive sleep apnea), and Type 2 diabetes mellitus (HCC).  has a past surgical history that includes Total vaginal hysterectomy and Bilateral salpingectomy (Bilateral). Prior to Admission medications   Medication Sig Start Date End Date Taking? Authorizing Provider  acetaminophen (TYLENOL) 325 MG tablet Take by mouth. 10/13/19  Yes [provider]  Aspirin-Salicylamide-Caffeine (ARTHRITIS  STRENGTH BC POWDER PO) Take by mouth.   Yes [provider]  Blood Glucose Monitoring Suppl DEVI 1 each by Does not apply route in the morning, at noon, and at bedtime. May substitute to any manufacturer covered by patient's insurance. 04/23/23  Yes Clark, Katherine K, NP  Glucose Blood (BLOOD GLUCOSE TEST STRIPS) STRP 1 each by In Vitro route in the morning, at noon, and at bedtime. May substitute to any manufacturer covered by patient's insurance. 04/23/23  Yes [name], NP  Lancets Misc. MISC 1 each by Does not apply route in the morning, at noon, and at bedtime. May substitute to any manufacturer covered by patient's insurance. 04/23/23  Yes [name], NP  rosuvastatin  (CRESTOR ) 5 MG tablet TAKE 1 TABLET (5 MG TOTAL) BY MOUTH DAILY FOR CHOLESTEROL 01/25/24  Yes Clark, Katherine K, NP  Dulaglutide  (TRULICITY ) 0.75 MG/0.5ML SOAJ Inject 0.75 mg into the skin once a week. for diabetes. Patient not taking: Reported on 06/21/2024 12/01/23   Clark, Katherine K, NP  meloxicam  (MOBIC ) 7.5 MG tablet TAKE 1 TAB BY MOUTH AT BEDTIME AS NEEDED FOR PAIN. Patient not taking: Reported on 06/21/2024 09/06/23   [name], NP   No Known Allergies  FAMILY HISTORY:  family history is not on file. SOCIAL HISTORY:  reports that she has never smoked. She has never used smokeless tobacco. She reports that she does not drink alcohol.   Review of Systems:  Gen:  Denies  fever, sweats, chills weight loss  HEENT: Denies  blurred vision, double vision, ear pain, eye pain, hearing loss, nose bleeds, sore throat Cardiac:  No dizziness, chest pain or heaviness, chest tightness,edema, No JVD Resp:   No cough, -sputum production, -shortness of breath,-wheezing, -hemoptysis,  Gi: Denies swallowing difficulty, stomach pain, nausea or vomiting, diarrhea, constipation, bowel incontinence Gu:  Denies bladder incontinence, burning urine Ext:   Denies Joint pain, stiffness or swelling Skin: Denies   skin rash, easy bruising or bleeding or hives Endoc:  Denies polyuria, polydipsia , polyphagia or weight change Psych:   Denies depression, insomnia or hallucinations  Other:  All other systems negative  VITAL SIGNS: BP 120/76 (BP Location: Right Arm, Cuff Size: Normal)   Pulse 80   Temp (!) 97.1 F (36.2 C)   Ht 4' 9 (1.448 m)   Wt 182 lb (82.6 kg)   LMP 11/18/2016   SpO2 98%   BMI 39.38 kg/m    Physical Examination:   General Appearance: No distress  EYES PERRLA, EOM intact.   NECK Supple, No JVD Pulmonary: normal breath sounds, No wheezing.  CardiovascularNormal S1,S2.  No m/r/g.   Abdomen: Benign, Soft, non-tender. Skin:   warm, no rashes, no ecchymosis  Extremities: normal, no cyanosis, clubbing. Neuro:without focal findings,  speech normal  PSYCHIATRIC: Mood, affect within normal limits.   ASSESSMENT AND PLAN  OSA I suspect that OSA is likely present due to clinical presentation. Discussed the consequences of untreated sleep apnea. Advised not to drive drowsy for safety of patient and others. Will complete further evaluation with a home sleep study and follow up to review results.    Obesity Counseled patient on diet and lifestyle modification.   Shift work sleep disorder Advised patient to keep a regular sleep schedule on all days.    MEDICATION ADJUSTMENTS/LABS AND TESTS ORDERED: Recommend Sleep Study   Patient  satisfied with Plan of action and management. All questions answered  Follow up to review HST results and treatment plan.   I spent a total of 30 minutes reviewing chart data, face-to-face evaluation with the patient, counseling and coordination of care as detailed above.    Eriana Suliman, M.D.  Sleep Medicine [facility]

## 2024-06-30 DIAGNOSIS — M9903 Segmental and somatic dysfunction of lumbar region: Secondary | ICD-10-CM | POA: Diagnosis not present

## 2024-06-30 DIAGNOSIS — M9902 Segmental and somatic dysfunction of thoracic region: Secondary | ICD-10-CM | POA: Diagnosis not present

## 2024-06-30 DIAGNOSIS — M9901 Segmental and somatic dysfunction of cervical region: Secondary | ICD-10-CM | POA: Diagnosis not present

## 2024-06-30 DIAGNOSIS — M9900 Segmental and somatic dysfunction of head region: Secondary | ICD-10-CM | POA: Diagnosis not present

## 2024-07-05 DIAGNOSIS — M9900 Segmental and somatic dysfunction of head region: Secondary | ICD-10-CM | POA: Diagnosis not present

## 2024-07-05 DIAGNOSIS — M9901 Segmental and somatic dysfunction of cervical region: Secondary | ICD-10-CM | POA: Diagnosis not present

## 2024-07-05 DIAGNOSIS — M9902 Segmental and somatic dysfunction of thoracic region: Secondary | ICD-10-CM | POA: Diagnosis not present

## 2024-07-05 DIAGNOSIS — M9903 Segmental and somatic dysfunction of lumbar region: Secondary | ICD-10-CM | POA: Diagnosis not present

## 2024-07-06 DIAGNOSIS — M9901 Segmental and somatic dysfunction of cervical region: Secondary | ICD-10-CM | POA: Diagnosis not present

## 2024-07-06 DIAGNOSIS — M9902 Segmental and somatic dysfunction of thoracic region: Secondary | ICD-10-CM | POA: Diagnosis not present

## 2024-07-06 DIAGNOSIS — M9903 Segmental and somatic dysfunction of lumbar region: Secondary | ICD-10-CM | POA: Diagnosis not present

## 2024-07-06 DIAGNOSIS — M9900 Segmental and somatic dysfunction of head region: Secondary | ICD-10-CM | POA: Diagnosis not present

## 2024-07-07 DIAGNOSIS — M9900 Segmental and somatic dysfunction of head region: Secondary | ICD-10-CM | POA: Diagnosis not present

## 2024-07-07 DIAGNOSIS — M9902 Segmental and somatic dysfunction of thoracic region: Secondary | ICD-10-CM | POA: Diagnosis not present

## 2024-07-07 DIAGNOSIS — M9901 Segmental and somatic dysfunction of cervical region: Secondary | ICD-10-CM | POA: Diagnosis not present

## 2024-07-07 DIAGNOSIS — M9903 Segmental and somatic dysfunction of lumbar region: Secondary | ICD-10-CM | POA: Diagnosis not present

## 2024-07-08 ENCOUNTER — Encounter

## 2024-07-08 DIAGNOSIS — G4733 Obstructive sleep apnea (adult) (pediatric): Secondary | ICD-10-CM

## 2024-07-11 DIAGNOSIS — M9900 Segmental and somatic dysfunction of head region: Secondary | ICD-10-CM | POA: Diagnosis not present

## 2024-07-11 DIAGNOSIS — M9902 Segmental and somatic dysfunction of thoracic region: Secondary | ICD-10-CM | POA: Diagnosis not present

## 2024-07-11 DIAGNOSIS — M9903 Segmental and somatic dysfunction of lumbar region: Secondary | ICD-10-CM | POA: Diagnosis not present

## 2024-07-11 DIAGNOSIS — M9901 Segmental and somatic dysfunction of cervical region: Secondary | ICD-10-CM | POA: Diagnosis not present

## 2024-07-12 DIAGNOSIS — M9903 Segmental and somatic dysfunction of lumbar region: Secondary | ICD-10-CM | POA: Diagnosis not present

## 2024-07-12 DIAGNOSIS — M9900 Segmental and somatic dysfunction of head region: Secondary | ICD-10-CM | POA: Diagnosis not present

## 2024-07-12 DIAGNOSIS — M9901 Segmental and somatic dysfunction of cervical region: Secondary | ICD-10-CM | POA: Diagnosis not present

## 2024-07-12 DIAGNOSIS — M9902 Segmental and somatic dysfunction of thoracic region: Secondary | ICD-10-CM | POA: Diagnosis not present

## 2024-07-13 DIAGNOSIS — M9901 Segmental and somatic dysfunction of cervical region: Secondary | ICD-10-CM | POA: Diagnosis not present

## 2024-07-13 DIAGNOSIS — M9903 Segmental and somatic dysfunction of lumbar region: Secondary | ICD-10-CM | POA: Diagnosis not present

## 2024-07-13 DIAGNOSIS — M9900 Segmental and somatic dysfunction of head region: Secondary | ICD-10-CM | POA: Diagnosis not present

## 2024-07-13 DIAGNOSIS — M9902 Segmental and somatic dysfunction of thoracic region: Secondary | ICD-10-CM | POA: Diagnosis not present

## 2024-07-19 DIAGNOSIS — M9902 Segmental and somatic dysfunction of thoracic region: Secondary | ICD-10-CM | POA: Diagnosis not present

## 2024-07-19 DIAGNOSIS — M9901 Segmental and somatic dysfunction of cervical region: Secondary | ICD-10-CM | POA: Diagnosis not present

## 2024-07-19 DIAGNOSIS — M9900 Segmental and somatic dysfunction of head region: Secondary | ICD-10-CM | POA: Diagnosis not present

## 2024-07-19 DIAGNOSIS — M9903 Segmental and somatic dysfunction of lumbar region: Secondary | ICD-10-CM | POA: Diagnosis not present

## 2024-07-21 ENCOUNTER — Ambulatory Visit: Payer: Self-pay

## 2024-07-21 DIAGNOSIS — G4733 Obstructive sleep apnea (adult) (pediatric): Secondary | ICD-10-CM | POA: Diagnosis not present

## 2024-07-21 NOTE — Telephone Encounter (Signed)
 Can we put Gloria Wade on Dr. Eleanore schedule?

## 2024-07-21 NOTE — Telephone Encounter (Signed)
 FYI Only or Action Required?: FYI only for provider.  Patient was last seen in primary care on 06/07/2024 by [name], NP.  Called Nurse Triage reporting Pain.  Symptoms began x 3 weeks.  Interventions attempted: Nothing.  Symptoms are: gradually worsening.  Triage Disposition: See PCP When Office is Open (Within 3 Days)  Patient/caregiver understands and will follow disposition?: Yes     Copied from CRM #[id number]. Topic: Clinical - Red Word Triage >> Jul 21, 2024 11:03 AM [name] wrote: Red Word that prompted transfer to Nurse Triage: Knee pain for 3 weeks. Very painful a little swelling. No other symptoms. It is getting worse Reason for Disposition  [1] MODERATE pain (e.g., interferes with normal activities, limping) AND [2] present > 3 days  Answer Assessment - Initial Assessment Questions 1. ONSET: When did the pain start?      X 3 weeks 2. LOCATION: Where is the pain located?      Right knee 3. PAIN: How bad is the pain?    (Scale 1-10; or mild, moderate, severe)     10/10 4. WORK OR EXERCISE: Has there been any recent work or exercise that involved this part of the body?      no 5. CAUSE: What do you think is causing the leg pain?     Unknown 6. OTHER SYMPTOMS: Do you have any other symptoms? (e.g., chest pain, back pain, breathing difficulty, swelling, rash, fever, numbness, weakness)     Purple, swelling,  7. PREGNANCY: Is there any chance you are pregnant? When was your last menstrual period?     nano  Protocols used: Leg Pain-A-AH

## 2024-07-21 NOTE — Telephone Encounter (Signed)
 Called patient and scheduled with Dr. Watt on 9/24.

## 2024-07-22 ENCOUNTER — Ambulatory Visit: Payer: Self-pay

## 2024-07-22 DIAGNOSIS — G4733 Obstructive sleep apnea (adult) (pediatric): Secondary | ICD-10-CM

## 2024-07-25 ENCOUNTER — Ambulatory Visit: Admitting: Family Medicine

## 2024-07-25 DIAGNOSIS — M9901 Segmental and somatic dysfunction of cervical region: Secondary | ICD-10-CM | POA: Diagnosis not present

## 2024-07-25 DIAGNOSIS — M9903 Segmental and somatic dysfunction of lumbar region: Secondary | ICD-10-CM | POA: Diagnosis not present

## 2024-07-25 DIAGNOSIS — M9900 Segmental and somatic dysfunction of head region: Secondary | ICD-10-CM | POA: Diagnosis not present

## 2024-07-25 DIAGNOSIS — M9902 Segmental and somatic dysfunction of thoracic region: Secondary | ICD-10-CM | POA: Diagnosis not present

## 2024-07-25 NOTE — Telephone Encounter (Signed)
 Spoke with to relay results and she is agreeable to starting CPAP therapy. Order placed.

## 2024-07-26 DIAGNOSIS — M9902 Segmental and somatic dysfunction of thoracic region: Secondary | ICD-10-CM | POA: Diagnosis not present

## 2024-07-26 DIAGNOSIS — M9903 Segmental and somatic dysfunction of lumbar region: Secondary | ICD-10-CM | POA: Diagnosis not present

## 2024-07-26 DIAGNOSIS — M9901 Segmental and somatic dysfunction of cervical region: Secondary | ICD-10-CM | POA: Diagnosis not present

## 2024-07-26 DIAGNOSIS — M9900 Segmental and somatic dysfunction of head region: Secondary | ICD-10-CM | POA: Diagnosis not present

## 2024-07-26 NOTE — Progress Notes (Signed)
 "    Gloria Tarver T. Jennalee Greaves, MD, CAQ Sports Medicine [facility] [address] [place], [address]  Phone: [phone]  FAX: [phone]  Gloria Wade - 48 y.o. female  MRN [id number]  Date of Birth: [birth year]  Date: 07/27/2024  PCP: [name], NP  Referral: [name], NP  Chief Complaint  Patient presents with   Knee Pain    Right   Subjective:   Gloria Wade is a 48 y.o. very pleasant female patient with Body mass index is 39.9 kg/m. who presents with the following:  Discussed the use of AI scribe software for clinical note transcription with the patient, who gave verbal consent to proceed.  Patient presents with ongoing knee pain. History of Present Illness Gloria Wade is a 48 year old female who presents with right knee pain following an injury.  She has been experiencing right knee pain for approximately one month after lifting a heavy table up the stairs. The pain is localized to the right knee with significant discomfort in specific areas. The pain is persistent and worsens with activities such as walking, climbing stairs, getting in and out of a car, and dressing. It is severe at night, affecting her sleep.  No locking up of the joint or functional giving way..  She has attempted treatment with a chiropractor without relief. She uses BC powder for pain relief, which provides temporary relief, but the pain returns. No other oral medications besides BC powder have been taken for the knee pain.  In addition to the right knee pain, she reports some pain in the back of her hip. She has a history of arthritis in the left knee, identified in x-rays taken a few years ago. She experiences some swelling and mild pain in the left knee, although it is not as severe as the right knee.  She has had a hysterectomy. She has a history of significant weight gain during her first pregnancy, which she associates with some of her joint  issues.    Review of Systems is noted in the HPI, as appropriate  Objective:   BP 110/74   Pulse 93   Temp (!) 97.5 F (36.4 C) (Temporal)   Ht 4' 9 (1.448 m)   Wt 184 lb 6 oz (83.6 kg)   LMP 11/18/2016   SpO2 97%   BMI 39.90 kg/m   GEN: No acute distress; alert,appropriate. PULM: Breathing comfortably in no respiratory distress PSYCH: Normally interactive.   Right knee: Full extension and flexion to 117 Mild effusion Very significant medial joint line tenderness with mild lateral joint line tenderness Low-level patellar motion with mild tenderness at the patellar facets ACL, PCL, and MCL are all intact Any type of terminal flexion causes pain Extension and bounce home testing are normal Otherwise neurovascularly intact  Laboratory and Imaging Data:  Assessment and Plan:     ICD-10-CM   1. Primary osteoarthritis of right knee  M17.11     2. Acute pain of right knee  M25.561 DG Knee 4 Views W/Patella Right    triamcinolone  acetonide (KENALOG -40) injection 40 mg     The radiological images were independently reviewed by myself in the office and results were reviewed with the patient. My independent interpretation of images:   Radiographs of the right knee show mild to moderate osteoarthritis with more moderate arthritis in the medial compartment Electronically Signed  By: Jacques Schroeder, MD On: 07/27/2024  3:20 PM EDT  Assessment &  Plan Right knee osteoarthritis with acute flare Moderate osteoarthritis with acute flare likely from carrying a heavy table. X-ray shows moderate arthritis with joint space narrowing. Possible degenerative meniscal tear. Significant pain reported, especially at night and with certain activities. - Prescribed celecoxib  once daily. - Administered cortisone injection to the right knee. Explained potential benefits and pain level. Informed that fullness will subside today and medication will take two to three days to work. - Advised using ice  on the knee at the end of the day. - Instructed to pick up medication from pharmacy.  Left knee osteoarthritis Left knee osteoarthritis with occasional pain, less severe than right knee. Some swelling, but good range of motion and stability.  Right hip pain Pain in the back of the right hip.  Aspiration/Injection Procedure Note Gloria Wade [birth year] Date of procedure: 07/27/2024  Procedure: Large Joint Aspiration / Injection of Knee Indications: Pain  Procedure Details Patient verbally consented to procedure. Risks, benefits, and alternatives explained. Sterilely prepped with Chloraprep. Ethyl cholride used for anesthesia. 9 cc Lidocaine 1% mixed with 1 mL of Kenalog  40 mg injected using the anteromedial approach without difficulty. No complications with procedure and tolerated well. Patient had decreased pain post-injection. Medication: 1 mL of Kenalog  40 mg    Medication Management during today's office visit: Meds ordered this encounter  Medications   celecoxib  (CELEBREX ) 200 MG capsule    Sig: Take 1 capsule (200 mg total) by mouth daily.    Dispense:  30 capsule    Refill:  1   triamcinolone  acetonide (KENALOG -40) injection 40 mg   Medications Discontinued During This Encounter  Medication Reason   meloxicam  (MOBIC ) 7.5 MG tablet Completed Course   Dulaglutide  (TRULICITY ) 0.75 MG/0.5ML SOAJ Completed Course    Orders placed today for conditions managed today: Orders Placed This Encounter  Procedures   DG Knee 4 Views W/Patella Right    Disposition: No follow-ups on file.  [employer] One speech-to-text software was used for transcription in this dictation.  Possible transcriptional errors can occur using Animal nutritionist.   Signed,  Jacques DASEN. Tanna Loeffler, MD   Outpatient Encounter Medications as of 07/27/2024  Medication Sig   acetaminophen (TYLENOL) 325 MG tablet Take by mouth.   Aspirin-Salicylamide-Caffeine (ARTHRITIS STRENGTH BC POWDER PO) Take by mouth.    Blood Glucose Monitoring Suppl DEVI 1 each by Does not apply route in the morning, at noon, and at bedtime. May substitute to any manufacturer covered by patient's insurance.   celecoxib  (CELEBREX ) 200 MG capsule Take 1 capsule (200 mg total) by mouth daily.   Glucose Blood (BLOOD GLUCOSE TEST STRIPS) STRP 1 each by In Vitro route in the morning, at noon, and at bedtime. May substitute to any manufacturer covered by patient's insurance.   Lancets Misc. MISC 1 each by Does not apply route in the morning, at noon, and at bedtime. May substitute to any manufacturer covered by patient's insurance.   rosuvastatin  (CRESTOR ) 5 MG tablet TAKE 1 TABLET (5 MG TOTAL) BY MOUTH DAILY FOR CHOLESTEROL   [DISCONTINUED] Dulaglutide  (TRULICITY ) 0.75 MG/0.5ML SOAJ Inject 0.75 mg into the skin once a week. for diabetes. (Patient not taking: Reported on 06/21/2024)   [DISCONTINUED] meloxicam  (MOBIC ) 7.5 MG tablet TAKE 1 TAB BY MOUTH AT BEDTIME AS NEEDED FOR PAIN. (Patient not taking: Reported on 06/21/2024)   [EXPIRED] triamcinolone  acetonide (KENALOG -40) injection 40 mg    No facility-administered encounter medications on file as of 07/27/2024.   "

## 2024-07-27 ENCOUNTER — Ambulatory Visit (INDEPENDENT_AMBULATORY_CARE_PROVIDER_SITE_OTHER): Admitting: Family Medicine

## 2024-07-27 ENCOUNTER — Ambulatory Visit (INDEPENDENT_AMBULATORY_CARE_PROVIDER_SITE_OTHER)
Admission: RE | Admit: 2024-07-27 | Discharge: 2024-07-27 | Disposition: A | Discharge status: Home / Self Care | Source: Ambulatory Visit | Attending: Family Medicine | Admitting: Family Medicine

## 2024-07-27 ENCOUNTER — Encounter: Payer: Self-pay | Admitting: Family Medicine

## 2024-07-27 VITALS — BP 110/74 | HR 93 | Temp 97.5°F | Ht <= 58 in | Wt 184.4 lb

## 2024-07-27 DIAGNOSIS — M25561 Pain in right knee: Secondary | ICD-10-CM | POA: Diagnosis not present

## 2024-07-27 DIAGNOSIS — M25562 Pain in left knee: Secondary | ICD-10-CM | POA: Diagnosis not present

## 2024-07-27 DIAGNOSIS — M9902 Segmental and somatic dysfunction of thoracic region: Secondary | ICD-10-CM | POA: Diagnosis not present

## 2024-07-27 DIAGNOSIS — M1711 Unilateral primary osteoarthritis, right knee: Secondary | ICD-10-CM | POA: Diagnosis not present

## 2024-07-27 DIAGNOSIS — M9903 Segmental and somatic dysfunction of lumbar region: Secondary | ICD-10-CM | POA: Diagnosis not present

## 2024-07-27 DIAGNOSIS — M9901 Segmental and somatic dysfunction of cervical region: Secondary | ICD-10-CM | POA: Diagnosis not present

## 2024-07-27 DIAGNOSIS — M1712 Unilateral primary osteoarthritis, left knee: Secondary | ICD-10-CM | POA: Diagnosis not present

## 2024-07-27 DIAGNOSIS — M9900 Segmental and somatic dysfunction of head region: Secondary | ICD-10-CM | POA: Diagnosis not present

## 2024-07-27 MED ORDER — TRIAMCINOLONE ACETONIDE 40 MG/ML IJ SUSP
40.0000 mg | Freq: Once | INTRAMUSCULAR | Status: AC
  Administered 2024-07-27: 40 mg via INTRA_ARTICULAR

## 2024-07-27 MED ORDER — CELECOXIB 200 MG PO CAPS: 200.0000 mg | ORAL_CAPSULE | Freq: Every day | ORAL | 1 refills | Status: DC

## 2024-07-29 ENCOUNTER — Encounter: Payer: Self-pay | Admitting: Family Medicine

## 2024-08-02 ENCOUNTER — Ambulatory Visit: Payer: Self-pay | Admitting: Family Medicine

## 2024-08-03 DIAGNOSIS — M9902 Segmental and somatic dysfunction of thoracic region: Secondary | ICD-10-CM | POA: Diagnosis not present

## 2024-08-03 DIAGNOSIS — M9901 Segmental and somatic dysfunction of cervical region: Secondary | ICD-10-CM | POA: Diagnosis not present

## 2024-08-03 DIAGNOSIS — M9903 Segmental and somatic dysfunction of lumbar region: Secondary | ICD-10-CM | POA: Diagnosis not present

## 2024-08-03 DIAGNOSIS — M9900 Segmental and somatic dysfunction of head region: Secondary | ICD-10-CM | POA: Diagnosis not present

## 2024-08-08 DIAGNOSIS — M9902 Segmental and somatic dysfunction of thoracic region: Secondary | ICD-10-CM | POA: Diagnosis not present

## 2024-08-08 DIAGNOSIS — M9904 Segmental and somatic dysfunction of sacral region: Secondary | ICD-10-CM | POA: Diagnosis not present

## 2024-08-08 DIAGNOSIS — M9905 Segmental and somatic dysfunction of pelvic region: Secondary | ICD-10-CM | POA: Diagnosis not present

## 2024-08-08 DIAGNOSIS — M9903 Segmental and somatic dysfunction of lumbar region: Secondary | ICD-10-CM | POA: Diagnosis not present

## 2024-08-08 DIAGNOSIS — M9900 Segmental and somatic dysfunction of head region: Secondary | ICD-10-CM | POA: Diagnosis not present

## 2024-08-08 DIAGNOSIS — M9901 Segmental and somatic dysfunction of cervical region: Secondary | ICD-10-CM | POA: Diagnosis not present

## 2024-08-10 DIAGNOSIS — M9905 Segmental and somatic dysfunction of pelvic region: Secondary | ICD-10-CM | POA: Diagnosis not present

## 2024-08-10 DIAGNOSIS — M9904 Segmental and somatic dysfunction of sacral region: Secondary | ICD-10-CM | POA: Diagnosis not present

## 2024-08-10 DIAGNOSIS — M9903 Segmental and somatic dysfunction of lumbar region: Secondary | ICD-10-CM | POA: Diagnosis not present

## 2024-08-10 DIAGNOSIS — M9900 Segmental and somatic dysfunction of head region: Secondary | ICD-10-CM | POA: Diagnosis not present

## 2024-08-10 DIAGNOSIS — M9901 Segmental and somatic dysfunction of cervical region: Secondary | ICD-10-CM | POA: Diagnosis not present

## 2024-08-10 DIAGNOSIS — M9902 Segmental and somatic dysfunction of thoracic region: Secondary | ICD-10-CM | POA: Diagnosis not present

## 2024-08-15 DIAGNOSIS — M9902 Segmental and somatic dysfunction of thoracic region: Secondary | ICD-10-CM | POA: Diagnosis not present

## 2024-08-15 DIAGNOSIS — M9901 Segmental and somatic dysfunction of cervical region: Secondary | ICD-10-CM | POA: Diagnosis not present

## 2024-08-15 DIAGNOSIS — M9904 Segmental and somatic dysfunction of sacral region: Secondary | ICD-10-CM | POA: Diagnosis not present

## 2024-08-15 DIAGNOSIS — M9900 Segmental and somatic dysfunction of head region: Secondary | ICD-10-CM | POA: Diagnosis not present

## 2024-08-15 DIAGNOSIS — M9905 Segmental and somatic dysfunction of pelvic region: Secondary | ICD-10-CM | POA: Diagnosis not present

## 2024-08-15 DIAGNOSIS — M9903 Segmental and somatic dysfunction of lumbar region: Secondary | ICD-10-CM | POA: Diagnosis not present

## 2024-08-22 DIAGNOSIS — M9901 Segmental and somatic dysfunction of cervical region: Secondary | ICD-10-CM | POA: Diagnosis not present

## 2024-08-22 DIAGNOSIS — M9905 Segmental and somatic dysfunction of pelvic region: Secondary | ICD-10-CM | POA: Diagnosis not present

## 2024-08-22 DIAGNOSIS — M9902 Segmental and somatic dysfunction of thoracic region: Secondary | ICD-10-CM | POA: Diagnosis not present

## 2024-08-22 DIAGNOSIS — M9903 Segmental and somatic dysfunction of lumbar region: Secondary | ICD-10-CM | POA: Diagnosis not present

## 2024-08-22 DIAGNOSIS — M9900 Segmental and somatic dysfunction of head region: Secondary | ICD-10-CM | POA: Diagnosis not present

## 2024-08-22 DIAGNOSIS — M9904 Segmental and somatic dysfunction of sacral region: Secondary | ICD-10-CM | POA: Diagnosis not present

## 2024-08-31 DIAGNOSIS — M9904 Segmental and somatic dysfunction of sacral region: Secondary | ICD-10-CM | POA: Diagnosis not present

## 2024-08-31 DIAGNOSIS — M9903 Segmental and somatic dysfunction of lumbar region: Secondary | ICD-10-CM | POA: Diagnosis not present

## 2024-08-31 DIAGNOSIS — M9901 Segmental and somatic dysfunction of cervical region: Secondary | ICD-10-CM | POA: Diagnosis not present

## 2024-08-31 DIAGNOSIS — M9905 Segmental and somatic dysfunction of pelvic region: Secondary | ICD-10-CM | POA: Diagnosis not present

## 2024-08-31 DIAGNOSIS — M9900 Segmental and somatic dysfunction of head region: Secondary | ICD-10-CM | POA: Diagnosis not present

## 2024-08-31 DIAGNOSIS — M9902 Segmental and somatic dysfunction of thoracic region: Secondary | ICD-10-CM | POA: Diagnosis not present

## 2024-09-06 ENCOUNTER — Ambulatory Visit: Payer: Self-pay

## 2024-09-06 NOTE — Telephone Encounter (Signed)
 FYI Only or Action Required?: FYI only for provider: appointment scheduled on 09/07/24.  Patient was last seen in primary care on 07/27/2024 by Watt Mirza, MD.  Called Nurse Triage reporting Cough and Otalgia.  Symptoms began several days ago.  Interventions attempted: OTC medications: Claritin, Nyquil.  Symptoms are: gradually worsening.  Triage Disposition: See Physician Within 24 Hours  Patient/caregiver understands and will follow disposition?: Yes  Copied from CRM #[id number]. Topic: Clinical - Red Word Triage >> Sep 06, 2024 10:01 AM Dedra B wrote: [facility] that prompted transfer to Nurse Triage: Pt having pain on top of her tongue, cough, and chills. Warm transfer to NT Reason for Disposition  SEVERE throat pain (e.g., excruciating)  Answer Assessment - Initial Assessment Questions 1. ONSET: When did the throat start hurting? (Hours or days ago)      Friday  2. SEVERITY: How bad is the sore throat? (Scale 1-10; mild, moderate or severe)     Severe, 8-9/10  3. STREP EXPOSURE: Has there been any exposure to strep within the past week? If Yes, ask: What type of contact occurred?      No  4.  VIRAL SYMPTOMS: Are there any symptoms of a cold, such as a runny nose, cough, hoarse voice or red eyes?      Cough, congestion  5. FEVER: Do you have a fever? If Yes, ask: What is your temperature, how was it measured, and when did it start?     Denies feeling feverish, reports chills.  6. PUS ON THE TONSILS: Is there pus on the tonsils in the back of your throat?     Denies  7. OTHER SYMPTOMS: Do you have any other symptoms? (e.g., difficulty breathing, headache, rash)     Bump on back of tongue, white. Cough with yellow sputum. Left ear pain  Protocols used: Sore Throat-A-AH

## 2024-09-07 ENCOUNTER — Other Ambulatory Visit: Payer: Self-pay | Admitting: Family Medicine

## 2024-09-07 ENCOUNTER — Ambulatory Visit (INDEPENDENT_AMBULATORY_CARE_PROVIDER_SITE_OTHER): Admitting: Family Medicine

## 2024-09-07 ENCOUNTER — Encounter: Payer: Self-pay | Admitting: Family Medicine

## 2024-09-07 VITALS — BP 126/74 | HR 87 | Temp 98.2°F | Resp 16 | Wt 183.3 lb

## 2024-09-07 DIAGNOSIS — H6502 Acute serous otitis media, left ear: Secondary | ICD-10-CM | POA: Diagnosis not present

## 2024-09-07 DIAGNOSIS — J029 Acute pharyngitis, unspecified: Secondary | ICD-10-CM

## 2024-09-07 DIAGNOSIS — K121 Other forms of stomatitis: Secondary | ICD-10-CM

## 2024-09-07 DIAGNOSIS — R6883 Chills (without fever): Secondary | ICD-10-CM

## 2024-09-07 DIAGNOSIS — R0981 Nasal congestion: Secondary | ICD-10-CM | POA: Diagnosis not present

## 2024-09-07 LAB — POCT RAPID STREP A (OFFICE): Rapid Strep A Screen: NEGATIVE

## 2024-09-07 LAB — POCT INFLUENZA A/B
Influenza A, POC: NEGATIVE
Influenza B, POC: NEGATIVE

## 2024-09-07 LAB — POC COVID19 BINAXNOW: SARS Coronavirus 2 Ag: NEGATIVE

## 2024-09-07 MED ORDER — AMOXICILLIN-POT CLAVULANATE 875-125 MG PO TABS: 1.0000 | ORAL_TABLET | Freq: Two times a day (BID) | ORAL | 0 refills | Status: DC

## 2024-09-07 MED ORDER — FLUTICASONE PROPIONATE 50 MCG/ACT NA SUSP: 2.0000 | Freq: Every day | NASAL | 0 refills | Status: DC

## 2024-09-07 NOTE — Progress Notes (Signed)
 Acute Office Visit  Introduced to nurse practitioner role and practice setting.  All questions answered.  Discussed provider/patient relationship and expectations.   Subjective:     Patient ID: Gloria Wade, female    DOB: [birth year], 48 y.o.   MRN: [id number]  Chief Complaint  Patient presents with   Sore Throat    Chills, sore throat, L ear pain onset 1 day otc: nyquil    Discussed the use of AI scribe software for clinical note transcription with the patient, who gave verbal consent to proceed.  History of Present Illness Gloria Wade is a 48 year old female who presents with sore throat, mouth sore, congestion, and ear pain.  Symptoms began on Saturday with a sore throat, which progressed to mouth sores and subsequently ear pain. The ear pain is described as deep pain. She experiences chills, though not severe, and a mild cough. Difficulty swallowing due to throat/mouth pain.  She reports a history of frequent ear infections as a child. She has been using Nyquil and Claritin D for symptom relief since feeling unwell, but does not take these medications regularly. Her regular medications include Crestor  and occasional pain relief medications.  No fever, nausea, vomiting, rash, diarrhea, abdominal pain, difficulty breathing, heart racing, or chest pain.  HPI  ROS      Objective:    BP 126/74   Pulse 87   Temp 98.2 F (36.8 C) (Oral)   Resp 16   Wt 183 lb 4.8 oz (83.1 kg)   LMP 11/18/2016   SpO2 98%   BMI 39.67 kg/m    Physical Exam Constitutional:      General: She is not in acute distress.    Appearance: She is well-developed and normal weight. She is not ill-appearing, toxic-appearing or diaphoretic.  HENT:     Head: Normocephalic.     Right Ear: Ear canal normal. No drainage, swelling or tenderness. A middle ear effusion is present. Tympanic membrane is injected, scarred and erythematous. Tympanic membrane is not bulging.     Left Ear: Ear canal normal. No  drainage, swelling or tenderness. A middle ear effusion is present. Tympanic membrane is injected, scarred and erythematous. Tympanic membrane is not bulging.     Nose: Congestion present. No rhinorrhea.     Mouth/Throat:     Mouth: Mucous membranes are moist. No oral lesions.     Palate: Lesions present.     Pharynx: Uvula midline. Posterior oropharyngeal erythema present. No pharyngeal swelling, oropharyngeal exudate or uvula swelling.     Tonsils: No tonsillar exudate or tonsillar abscesses. 0 on the right. 0 on the left.  Eyes:     Extraocular Movements:     Right eye: Normal extraocular motion.     Left eye: Normal extraocular motion.     Conjunctiva/sclera: Conjunctivae normal.     Pupils: Pupils are equal, round, and reactive to light.  Cardiovascular:     Rate and Rhythm: Normal rate and regular rhythm.     Heart sounds: No murmur heard.    No gallop.  Pulmonary:     Effort: Pulmonary effort is normal. No respiratory distress.     Breath sounds: Normal breath sounds. No stridor. No wheezing, rhonchi or rales.  Chest:     Chest wall: No tenderness.  Lymphadenopathy:     Cervical: Cervical adenopathy present.  Neurological:     Mental Status: She is alert.     Results for orders placed or performed in visit on 09/07/24  POCT rapid strep A  Result Value Ref Range   Rapid Strep A Screen Negative Negative  POC COVID-19  Result Value Ref Range   SARS Coronavirus 2 Ag Negative Negative  POCT Influenza A/B  Result Value Ref Range   Influenza A, POC Negative Negative   Influenza B, POC Negative Negative        Assessment & Plan:  Assessment and Plan Assessment & Plan Acute pharyngitis and sinus congestion - neg for covid, flu, rapid strep - associated with congestion mouth and ear pain - symptoms started four days ago, does not if she had fever, but had chills - took photo of throat yesterday, noticed sores - 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 - hx of uncontrolled DMII. Will write for abx - augmentin BID for 7 days - referral to ENT for hard palate lesion, as patient not aware of when it started   Hard Palate Ulcer - states with sore throat she decided to take photo of throat, incidentally found ulcer on hard palate, unsure if this is related to patients current symptoms.  - endorses have hx of abnormal uvula cells?, which part of it was removed in past - she was referral in May for throat irritation due to mass on tip of uvula.  - given hx going to re-refer, ENT vs dental - discussed with patient, pt to reach out if ulcer does not improve with abx     Problem List Items Addressed This Visit   None Visit Diagnoses       Sore throat    -  Primary   Relevant Orders   POCT rapid strep A (Completed)   Ambulatory referral to ENT     Non-recurrent acute serous otitis media of left ear       Relevant Medications   amoxicillin-clavulanate (AUGMENTIN) 875-125 MG tablet     Congestion of nasal sinus       Relevant Medications   fluticasone  (FLONASE ) 50 MCG/ACT nasal spray     Hard palate ulcer       Relevant Orders   Ambulatory referral to ENT     Chills (without fever)       Relevant Orders   POC COVID-19 (Completed)   POCT Influenza A/B (Completed)       Meds ordered this encounter  Medications   amoxicillin-clavulanate (AUGMENTIN) 875-125 MG tablet    Sig: Take 1 tablet by mouth 2 (two) times daily.    Dispense:  14 tablet    Refill:  0   fluticasone  (FLONASE ) 50 MCG/ACT nasal spray    Sig: Place 2 sprays into both nostrils daily.    Dispense:  16 g    Refill:  0    Return if symptoms worsen or  fail to improve.  Curtis DELENA Boom, FNP  I, Curtis DELENA Boom, FNP, have reviewed all documentation for this visit. The documentation on 09/07/24 for the exam, diagnosis, procedures, and orders are all accurate and complete.

## 2024-09-08 DIAGNOSIS — H9202 Otalgia, left ear: Secondary | ICD-10-CM | POA: Diagnosis not present

## 2024-09-08 DIAGNOSIS — K120 Recurrent oral aphthae: Secondary | ICD-10-CM | POA: Diagnosis not present

## 2024-09-08 DIAGNOSIS — J358 Other chronic diseases of tonsils and adenoids: Secondary | ICD-10-CM | POA: Diagnosis not present

## 2024-09-27 ENCOUNTER — Other Ambulatory Visit: Payer: Self-pay | Admitting: Family Medicine

## 2024-09-27 NOTE — Telephone Encounter (Signed)
 Last office visit 07/27/2024 with Dr. Watt for Osteoarthritis of Right Knee.  Last refilled 07/27/2024 for #30 with 1 refill.  Next appt: 12/08/24 for CPE with Mallie.

## 2024-12-08 ENCOUNTER — Ambulatory Visit: Payer: Self-pay | Admitting: Primary Care

## 2024-12-08 ENCOUNTER — Encounter: Payer: Self-pay | Admitting: Primary Care

## 2024-12-08 VITALS — BP 116/70 | HR 80 | Temp 97.8°F | Ht 59.0 in | Wt 191.0 lb

## 2024-12-08 DIAGNOSIS — G8929 Other chronic pain: Secondary | ICD-10-CM

## 2024-12-08 DIAGNOSIS — E785 Hyperlipidemia, unspecified: Secondary | ICD-10-CM

## 2024-12-08 DIAGNOSIS — Z0001 Encounter for general adult medical examination with abnormal findings: Secondary | ICD-10-CM

## 2024-12-08 DIAGNOSIS — Z23 Encounter for immunization: Secondary | ICD-10-CM

## 2024-12-08 DIAGNOSIS — N393 Stress incontinence (female) (male): Secondary | ICD-10-CM | POA: Insufficient documentation

## 2024-12-08 DIAGNOSIS — G4733 Obstructive sleep apnea (adult) (pediatric): Secondary | ICD-10-CM

## 2024-12-08 DIAGNOSIS — R3129 Other microscopic hematuria: Secondary | ICD-10-CM

## 2024-12-08 DIAGNOSIS — Z1231 Encounter for screening mammogram for malignant neoplasm of breast: Secondary | ICD-10-CM

## 2024-12-08 DIAGNOSIS — E559 Vitamin D deficiency, unspecified: Secondary | ICD-10-CM

## 2024-12-08 DIAGNOSIS — E1165 Type 2 diabetes mellitus with hyperglycemia: Secondary | ICD-10-CM

## 2024-12-08 LAB — POC URINALSYSI DIPSTICK (AUTOMATED)
Bilirubin, UA: NEGATIVE
Glucose, UA: NEGATIVE
Ketones, UA: NEGATIVE
Leukocytes, UA: NEGATIVE
Nitrite, UA: NEGATIVE
Protein, UA: POSITIVE — AB
Spec Grav, UA: 1.025
Urobilinogen, UA: 0.2 U/dL
pH, UA: 6

## 2024-12-08 NOTE — Assessment & Plan Note (Addendum)
 Never connected with pulmonology as insurance would not cover CPAP. Phone number provided for patient to connect as she has new insurance

## 2024-12-08 NOTE — Assessment & Plan Note (Signed)
Repeat vitamin D level pending. 

## 2024-12-08 NOTE — Assessment & Plan Note (Signed)
 Likely secondary to history of hysterectomy and prior pregnancies. Tended to discuss causation in her situation.  Urinalysis today with 1+ blood, otherwise negative. Urine culture ordered and pending. There is no enough urine to provide microscopic  We discussed options for treatment including medication versus pelvic floor PT. Referral placed for pelvic floor PT.  Handout provided for Kegel exercises

## 2024-12-08 NOTE — Assessment & Plan Note (Signed)
 Repeat A1c pending. Continue rosuvastatin  5 mg daily.

## 2024-12-08 NOTE — Assessment & Plan Note (Signed)
"  Referral placed for physical therapy   "

## 2024-12-08 NOTE — Patient Instructions (Addendum)
 Stop by the lab prior to leaving today. I will notify you of your results once received.   You will either be contacted via phone regarding your referral to physical therapy for back pain and physical therapy for urinary incontinence, or you may receive a letter on your MyChart portal from our referral team with instructions for scheduling an appointment. Please let us  know if you have not been contacted by anyone within two weeks.  Call [facility] in [place] for your sleep apnea/CPAP machine. This will help with sleep. [phone] . Schedule an appoint with Dr. Watt for the knee injections  Please schedule a follow up visit for 6 months for a diabetes check.  It was a pleasure to see you today!

## 2024-12-08 NOTE — Addendum Note (Signed)
 Addended by: JACKOLYN PLANAS on: 12/08/2024 03:32 PM   Modules accepted: Orders

## 2024-12-08 NOTE — Assessment & Plan Note (Signed)
 Immunizations UTD. First Shingrix vaccine provided today.  Discussed instructions for second Shingrix vaccine Mammogram due, orders placed. Colon cancer screening up-to-date, Cologuard due again in 2028  Discussed the importance of a healthy diet and regular exercise in order for weight loss, and to reduce the risk of further co-morbidity.  Exam stable. Labs pending.  Follow up in 1 year for repeat physical.

## 2024-12-08 NOTE — Progress Notes (Signed)
 "  Subjective:    Patient ID: Gloria Wade, female    DOB: Jul 11, 1976, 49 y.o.   MRN: [id number]  Gloria Wade is a very pleasant 49 y.o. female who presents today for complete physical and follow up of chronic conditions.  Her son joins us  today  She would also like to discuss urinary incontinence.  Chronic for the last 3-4 months mostly when she sneezes and coughs. Sh's also experienced leakage at night with changing positions. She denies hematuria, pelvic pain, urinary frequency.   She would also like a referral to physical therapy for her chronic back pain.  Immunizations: -Tetanus: Completed in 2021 -Influenza: Declines influenza vaccine.  -Shingles: Never completed  -Pneumonia: Completed in 2025  Diet: Fair diet.  Exercise: No regular exercise.  Eye exam: No recent visit.  Dental exam: Completed > 1 year ago   Pap Smear: Hysterectomy Mammogram: Completed in February 2025  Colonoscopy: Completed Cologuard in 2025, negative  BP Readings from Last 3 Encounters:  12/08/24 116/70  09/07/24 126/74  07/27/24 110/74       Review of Systems  Constitutional:  Negative for unexpected weight change.  HENT:  Negative for rhinorrhea.   Respiratory:  Negative for cough and shortness of breath.   Cardiovascular:  Negative for chest pain.  Gastrointestinal:  Negative for constipation and diarrhea.  Genitourinary:  Negative for difficulty urinating.       Urinary incontinence   Musculoskeletal:  Positive for arthralgias and back pain.  Skin:  Negative for rash.  Allergic/Immunologic: Negative for environmental allergies.  Neurological:  Negative for dizziness and headaches.  Psychiatric/Behavioral:  Positive for sleep disturbance.          Past Medical History:  Diagnosis Date   Hyperlipidemia    OSA (obstructive sleep apnea)    Type 2 diabetes mellitus (HCC)     Social History   Socioeconomic History   Marital status: Married    Spouse name: Not on file    Number of children: Not on file   Years of education: Not on file   Highest education level: Not on file  Occupational History   Not on file  Tobacco Use   Smoking status: Never   Smokeless tobacco: Never  Substance and Sexual Activity   Alcohol use: No   Drug use: Not on file   Sexual activity: Not on file  Other Topics Concern   Not on file  Social History Narrative   Not on file   Social Drivers of Health   Tobacco Use: Low Risk (12/08/2024)   Patient History    Smoking Tobacco Use: Never    Smokeless Tobacco Use: Never    Passive Exposure: Not on file  Financial Resource Strain: Not on file  Food Insecurity: Not on file  Transportation Needs: Not on file  Physical Activity: Not on file  Stress: Not on file  Social Connections: Not on file  Intimate Partner Violence: Not on file  Depression (EYV7-0): Low Risk (12/08/2024)   Depression (PHQ2-9)    PHQ-2 Score: 3  Alcohol Screen: Not on file  Housing: Not on file  Utilities: Not on file  Health Literacy: Not on file    Past Surgical History:  Procedure Laterality Date   BILATERAL SALPINGECTOMY Bilateral    TOTAL VAGINAL HYSTERECTOMY      History reviewed. No pertinent family history.  Allergies[1]  Medications Ordered Prior to Encounter[2]  BP 116/70   Pulse 80   Temp 97.8 F (36.6 C) (Oral)  Ht 4' 11 (1.499 m)   Wt 191 lb (86.6 kg)   LMP 11/18/2016   SpO2 98%   BMI 38.58 kg/m  Objective:   Physical Exam HENT:     Right Ear: Tympanic membrane and ear canal normal.     Left Ear: Tympanic membrane and ear canal normal.  Eyes:     Pupils: Pupils are equal, round, and reactive to light.  Cardiovascular:     Rate and Rhythm: Normal rate and regular rhythm.  Pulmonary:     Effort: Pulmonary effort is normal.     Breath sounds: Normal breath sounds.  Abdominal:     General: Bowel sounds are normal.     Palpations: Abdomen is soft.     Tenderness: There is no abdominal tenderness.  Musculoskeletal:         General: Normal range of motion.     Cervical back: Neck supple.  Skin:    General: Skin is warm and dry.  Neurological:     Mental Status: She is alert and oriented to person, place, and time.     Cranial Nerves: No cranial nerve deficit.     Deep Tendon Reflexes:     Reflex Scores:      Patellar reflexes are 2+ on the right side and 2+ on the left side. Psychiatric:        Mood and Affect: Mood normal.     Physical Exam        Assessment & Plan:  Encounter for annual general medical examination with abnormal findings in adult Assessment & Plan: Immunizations UTD. First Shingrix vaccine provided today.  Discussed instructions for second Shingrix vaccine Mammogram due, orders placed. Colon cancer screening up-to-date, Cologuard due again in 2028  Discussed the importance of a healthy diet and regular exercise in order for weight loss, and to reduce the risk of further co-morbidity.  Exam stable. Labs pending.  Follow up in 1 year for repeat physical.    Stress incontinence of urine Assessment & Plan: Likely secondary to history of hysterectomy and prior pregnancies. Tended to discuss causation in her situation.  Urinalysis today with 1+ blood, otherwise negative. Urine culture ordered and pending. There is no enough urine to provide microscopic  We discussed options for treatment including medication versus pelvic floor PT. Referral placed for pelvic floor PT.  Handout provided for Kegel exercises  Orders: -     POCT Urinalysis Dipstick (Automated) -     Ambulatory referral to Physical Therapy -     Ambulatory referral to Physical Therapy  Type 2 diabetes mellitus with hyperglycemia, without long-term current use of insulin (HCC) Assessment & Plan: Repeat A1C pending.  Work on a healthy diet and regular exercise in order for weight loss, and to reduce the risk of further co-morbidity. Continue off treatment.   Orders: -     Hemoglobin  A1c  Hyperlipidemia, unspecified hyperlipidemia type Assessment & Plan: Repeat A1c pending. Continue rosuvastatin  5 mg daily.  Orders: -     Lipid panel -     Comprehensive metabolic panel with GFR  Chronic bilateral low back pain without sciatica Assessment & Plan: Referral placed for physical therapy  Orders: -     Ambulatory referral to Physical Therapy -     Ambulatory referral to Physical Therapy  Microscopic hematuria -     Urine Culture -     CBC  Screening mammogram for breast cancer -     3D Screening Mammogram, Left and  Right; Future  OSA (obstructive sleep apnea) Assessment & Plan: Never connected with pulmonology as insurance would not cover CPAP. Phone number provided for patient to connect as she has new insurance   Vitamin D  deficiency Assessment & Plan: Repeat vitamin D  level pending  Orders: -     VITAMIN D  25 Hydroxy (Vit-D Deficiency, Fractures)    Assessment and Plan Assessment & Plan         Comer MARLA Gaskins, NP       [1] No Known Allergies [2]  Current Outpatient Medications on File Prior to Visit  Medication Sig Dispense Refill   acetaminophen (TYLENOL) 325 MG tablet Take by mouth.     Aspirin-Salicylamide-Caffeine (ARTHRITIS STRENGTH BC POWDER PO) Take by mouth.     Blood Glucose Monitoring Suppl DEVI 1 each by Does not apply route in the morning, at noon, and at bedtime. May substitute to any manufacturer covered by patient's insurance. 1 each 0   Glucose Blood (BLOOD GLUCOSE TEST STRIPS) STRP 1 each by In Vitro route in the morning, at noon, and at bedtime. May substitute to any manufacturer covered by patient's insurance. 300 strip 0   Lancets Misc. MISC 1 each by Does not apply route in the morning, at noon, and at bedtime. May substitute to any manufacturer covered by patient's insurance. 300 each 0   rosuvastatin  (CRESTOR ) 5 MG tablet TAKE 1 TABLET (5 MG TOTAL) BY MOUTH DAILY FOR CHOLESTEROL 90 tablet 2   celecoxib   (CELEBREX ) 200 MG capsule TAKE 1 CAPSULE BY MOUTH EVERY DAY (Patient not taking: Reported on 12/08/2024) 30 capsule 1   No current facility-administered medications on file prior to visit.   "

## 2024-12-08 NOTE — Assessment & Plan Note (Signed)
 Repeat A1C pending.  Work on a healthy diet and regular exercise in order for weight loss, and to reduce the risk of further co-morbidity. Continue off treatment.

## 2024-12-09 ENCOUNTER — Ambulatory Visit: Payer: Self-pay | Admitting: Primary Care

## 2024-12-09 LAB — CBC
HCT: 39.5 % (ref 36.0–46.0)
Hemoglobin: 13 g/dL (ref 12.0–15.0)
MCHC: 32.8 g/dL (ref 30.0–36.0)
MCV: 79.7 fl (ref 78.0–100.0)
Platelets: 264 10*3/uL (ref 150.0–400.0)
RBC: 4.96 Mil/uL (ref 3.87–5.11)
RDW: 14.4 % (ref 11.5–15.5)
WBC: 7.9 10*3/uL (ref 4.0–10.5)

## 2024-12-09 LAB — COMPREHENSIVE METABOLIC PANEL WITH GFR
ALT: 26 U/L (ref 3–35)
AST: 22 U/L (ref 5–37)
Albumin: 4.2 g/dL (ref 3.5–5.2)
Alkaline Phosphatase: 50 U/L (ref 39–117)
BUN: 13 mg/dL (ref 6–23)
CO2: 29 meq/L (ref 19–32)
Calcium: 9.7 mg/dL (ref 8.4–10.5)
Chloride: 101 meq/L (ref 96–112)
Creatinine, Ser: 0.55 mg/dL (ref 0.40–1.20)
GFR: 106.75 mL/min
Glucose, Bld: 109 mg/dL — ABNORMAL HIGH (ref 70–99)
Potassium: 3.9 meq/L (ref 3.5–5.1)
Sodium: 139 meq/L (ref 135–145)
Total Bilirubin: 0.5 mg/dL (ref 0.2–1.2)
Total Protein: 7.3 g/dL (ref 6.0–8.3)

## 2024-12-09 LAB — HEMOGLOBIN A1C: Hgb A1c MFr Bld: 6.1 % (ref 4.6–6.5)

## 2024-12-09 LAB — LIPID PANEL
Cholesterol: 219 mg/dL — ABNORMAL HIGH (ref 28–200)
HDL: 55 mg/dL
LDL Cholesterol: 136 mg/dL — ABNORMAL HIGH (ref 10–99)
NonHDL: 163.81
Total CHOL/HDL Ratio: 4
Triglycerides: 138 mg/dL (ref 10.0–149.0)
VLDL: 27.6 mg/dL (ref 0.0–40.0)

## 2024-12-09 LAB — VITAMIN D 25 HYDROXY (VIT D DEFICIENCY, FRACTURES): VITD: 16.17 ng/mL — ABNORMAL LOW (ref 30.00–100.00)

## 2024-12-14 ENCOUNTER — Ambulatory Visit: Admitting: Family Medicine

## 2024-12-29 ENCOUNTER — Ambulatory Visit

## 2025-01-05 ENCOUNTER — Ambulatory Visit

## 2025-01-26 ENCOUNTER — Ambulatory Visit

## 2025-02-02 ENCOUNTER — Ambulatory Visit

## 2025-02-09 ENCOUNTER — Ambulatory Visit

## 2025-02-16 ENCOUNTER — Ambulatory Visit

## 2025-02-23 ENCOUNTER — Ambulatory Visit

## 2025-03-02 ENCOUNTER — Ambulatory Visit

## 2025-03-09 ENCOUNTER — Ambulatory Visit

## 2025-03-16 ENCOUNTER — Ambulatory Visit

## 2025-06-07 ENCOUNTER — Ambulatory Visit: Admitting: Primary Care
